# Patient Record
Sex: Female | Born: 1965 | Race: White | Hispanic: No | Marital: Married | Smoking: Current every day smoker
Health system: Southern US, Community
[De-identification: ages and names within clinical notes are randomized; demographics above are authoritative.]

## PROBLEM LIST (undated history)

## (undated) DIAGNOSIS — M1990 Unspecified osteoarthritis, unspecified site: Secondary | ICD-10-CM

## (undated) DIAGNOSIS — T7840XA Allergy, unspecified, initial encounter: Secondary | ICD-10-CM

## (undated) DIAGNOSIS — B019 Varicella without complication: Secondary | ICD-10-CM

## (undated) DIAGNOSIS — Z87440 Personal history of urinary (tract) infections: Secondary | ICD-10-CM

## (undated) DIAGNOSIS — G43909 Migraine, unspecified, not intractable, without status migrainosus: Secondary | ICD-10-CM

## (undated) HISTORY — DX: Personal history of urinary (tract) infections: Z87.440

## (undated) HISTORY — DX: Unspecified osteoarthritis, unspecified site: M19.90

## (undated) HISTORY — DX: Migraine, unspecified, not intractable, without status migrainosus: G43.909

## (undated) HISTORY — DX: Allergy, unspecified, initial encounter: T78.40XA

## (undated) HISTORY — DX: Varicella without complication: B01.9

---

## 1993-03-16 HISTORY — PX: BREAST BIOPSY: SHX20

## 1993-03-16 HISTORY — PX: BREAST EXCISIONAL BIOPSY: SUR124

## 2006-03-16 HISTORY — PX: HEMANGIOMA EXCISION: SHX1734

## 2016-03-16 DIAGNOSIS — N920 Excessive and frequent menstruation with regular cycle: Secondary | ICD-10-CM

## 2016-03-16 HISTORY — DX: Excessive and frequent menstruation with regular cycle: N92.0

## 2019-01-16 ENCOUNTER — Ambulatory Visit: Payer: 59 | Admitting: Family Medicine

## 2019-01-16 ENCOUNTER — Encounter: Payer: Self-pay | Admitting: Family Medicine

## 2019-01-16 ENCOUNTER — Other Ambulatory Visit: Payer: Self-pay

## 2019-01-16 VITALS — BP 114/77 | HR 74 | Temp 97.9°F | Resp 17 | Ht 64.5 in | Wt 123.4 lb

## 2019-01-16 DIAGNOSIS — Z130 Encounter for screening for diseases of the blood and blood-forming organs and certain disorders involving the immune mechanism: Secondary | ICD-10-CM | POA: Diagnosis not present

## 2019-01-16 DIAGNOSIS — Z131 Encounter for screening for diabetes mellitus: Secondary | ICD-10-CM

## 2019-01-16 DIAGNOSIS — Z13220 Encounter for screening for lipoid disorders: Secondary | ICD-10-CM | POA: Diagnosis not present

## 2019-01-16 DIAGNOSIS — Z0000 Encounter for general adult medical examination without abnormal findings: Secondary | ICD-10-CM | POA: Diagnosis not present

## 2019-01-16 DIAGNOSIS — Z23 Encounter for immunization: Secondary | ICD-10-CM | POA: Diagnosis not present

## 2019-01-16 DIAGNOSIS — Z1231 Encounter for screening mammogram for malignant neoplasm of breast: Secondary | ICD-10-CM

## 2019-01-16 DIAGNOSIS — Z114 Encounter for screening for human immunodeficiency virus [HIV]: Secondary | ICD-10-CM

## 2019-01-16 DIAGNOSIS — Z720 Tobacco use: Secondary | ICD-10-CM

## 2019-01-16 DIAGNOSIS — Z803 Family history of malignant neoplasm of breast: Secondary | ICD-10-CM

## 2019-01-16 DIAGNOSIS — Z975 Presence of (intrauterine) contraceptive device: Secondary | ICD-10-CM

## 2019-01-16 LAB — COMPREHENSIVE METABOLIC PANEL
ALT: 10 U/L (ref 0–35)
AST: 13 U/L (ref 0–37)
Albumin: 4.4 g/dL (ref 3.5–5.2)
Alkaline Phosphatase: 49 U/L (ref 39–117)
BUN: 15 mg/dL (ref 6–23)
CO2: 31 mEq/L (ref 19–32)
Calcium: 9.7 mg/dL (ref 8.4–10.5)
Chloride: 102 mEq/L (ref 96–112)
Creatinine, Ser: 0.67 mg/dL (ref 0.40–1.20)
GFR: 92.11 mL/min (ref >60.00)
Glucose, Bld: 81 mg/dL (ref 70–99)
Potassium: 4.7 mEq/L (ref 3.5–5.1)
Sodium: 140 mEq/L (ref 135–145)
Total Bilirubin: 0.7 mg/dL (ref 0.2–1.2)
Total Protein: 6.3 g/dL (ref 6.0–8.3)

## 2019-01-16 LAB — LIPID PANEL
Cholesterol: 204 mg/dL — ABNORMAL HIGH (ref 0–200)
HDL: 75.9 mg/dL (ref >39.00)
LDL Cholesterol: 115 mg/dL — ABNORMAL HIGH (ref 0–99)
NonHDL: 128.17
Total CHOL/HDL Ratio: 3
Triglycerides: 64 mg/dL (ref 0.0–149.0)
VLDL: 12.8 mg/dL (ref 0.0–40.0)

## 2019-01-16 LAB — CBC
HCT: 37.1 % (ref 36.0–46.0)
Hemoglobin: 12.5 g/dL (ref 12.0–15.0)
MCHC: 33.8 g/dL (ref 30.0–36.0)
MCV: 92.7 fl (ref 78.0–100.0)
Platelets: 356 10*3/uL (ref 150.0–400.0)
RBC: 4 Mil/uL (ref 3.87–5.11)
RDW: 13.2 % (ref 11.5–15.5)
WBC: 4.9 10*3/uL (ref 4.0–10.5)

## 2019-01-16 LAB — HEMOGLOBIN A1C: Hgb A1c MFr Bld: 5.8 % (ref 4.6–6.5)

## 2019-01-16 NOTE — Patient Instructions (Addendum)
Health Maintenance, Female Adopting a healthy lifestyle and getting preventive care are important in promoting health and wellness. Ask your health care provider about:  The right schedule for you to have regular tests and exams.  Things you can do on your own to prevent diseases and keep yourself healthy. What should I know about diet, weight, and exercise? Eat a healthy diet   Eat a diet that includes plenty of vegetables, fruits, low-fat dairy products, and lean protein.  Do not eat a lot of foods that are high in solid fats, added sugars, or sodium. Maintain a healthy weight Body mass index (BMI) is used to identify weight problems. It estimates body fat based on height and weight. Your health care provider can help determine your BMI and help you achieve or maintain a healthy weight. Get regular exercise Get regular exercise. This is one of the most important things you can do for your health. Most adults should:  Exercise for at least 150 minutes each week. The exercise should increase your heart rate and make you sweat (moderate-intensity exercise).  Do strengthening exercises at least twice a week. This is in addition to the moderate-intensity exercise.  Spend less time sitting. Even light physical activity can be beneficial. Watch cholesterol and blood lipids Have your blood tested for lipids and cholesterol at 53 years of age, then have this test every 5 years. Have your cholesterol levels checked more often if:  Your lipid or cholesterol levels are high.  You are older than 53 years of age.  You are at high risk for heart disease. What should I know about cancer screening? Depending on your health history and family history, you may need to have cancer screening at various ages. This may include screening for:  Breast cancer.  Cervical cancer.  Colorectal cancer.  Skin cancer.  Lung cancer. What should I know about heart disease, diabetes, and high blood  pressure? Blood pressure and heart disease  High blood pressure causes heart disease and increases the risk of stroke. This is more likely to develop in people who have high blood pressure readings, are of African descent, or are overweight.  Have your blood pressure checked: ? Every 3-5 years if you are 18-39 years of age. ? Every year if you are 40 years old or older. Diabetes Have regular diabetes screenings. This checks your fasting blood sugar level. Have the screening done:  Once every three years after age 40 if you are at a normal weight and have a low risk for diabetes.  More often and at a younger age if you are overweight or have a high risk for diabetes. What should I know about preventing infection? Hepatitis B If you have a higher risk for hepatitis B, you should be screened for this virus. Talk with your health care provider to find out if you are at risk for hepatitis B infection. Hepatitis C Testing is recommended for:  Everyone born from 1945 through 1965.  Anyone with known risk factors for hepatitis C. Sexually transmitted infections (STIs)  Get screened for STIs, including gonorrhea and chlamydia, if: ? You are sexually active and are younger than 53 years of age. ? You are older than 53 years of age and your health care provider tells you that you are at risk for this type of infection. ? Your sexual activity has changed since you were last screened, and you are at increased risk for chlamydia or gonorrhea. Ask your health care provider if   you are at risk.  Ask your health care provider about whether you are at high risk for HIV. Your health care provider may recommend a prescription medicine to help prevent HIV infection. If you choose to take medicine to prevent HIV, you should first get tested for HIV. You should then be tested every 3 months for as long as you are taking the medicine. Pregnancy  If you are about to stop having your period (premenopausal) and  you may become pregnant, seek counseling before you get pregnant.  Take 400 to 800 micrograms (mcg) of folic acid every day if you become pregnant.  Ask for birth control (contraception) if you want to prevent pregnancy. Osteoporosis and menopause Osteoporosis is a disease in which the bones lose minerals and strength with aging. This can result in bone fractures. If you are 2 years old or older, or if you are at risk for osteoporosis and fractures, ask your health care provider if you should:  Be screened for bone loss.  Take a calcium or vitamin D supplement to lower your risk of fractures.  Be given hormone replacement therapy (HRT) to treat symptoms of menopause. Follow these instructions at home: Lifestyle  Do not use any products that contain nicotine or tobacco, such as cigarettes, e-cigarettes, and chewing tobacco. If you need help quitting, ask your health care provider.  Do not use street drugs.  Do not share needles.  Ask your health care provider for help if you need support or information about quitting drugs. Alcohol use  Do not drink alcohol if: ? Your health care provider tells you not to drink. ? You are pregnant, may be pregnant, or are planning to become pregnant.  If you drink alcohol: ? Limit how much you use to 0-1 drink a day. ? Limit intake if you are breastfeeding.  Be aware of how much alcohol is in your drink. In the [place] one drink equals one 12 oz bottle of beer (355 mL), one 5 oz glass of wine (148 mL), or one 1 oz glass of hard liquor (44 mL). General instructions  Schedule regular health, dental, and eye exams.  Stay current with your vaccines.  Tell your health care provider if: ? You often feel depressed. ? You have ever been abused or do not feel safe at home. Summary  Adopting a healthy lifestyle and getting preventive care are important in promoting health and wellness.  Follow your health care provider's instructions about healthy  diet, exercising, and getting tested or screened for diseases.  Follow your health care provider's instructions on monitoring your cholesterol and blood pressure. This information is not intended to replace advice given to you by your health care provider. Make sure you discuss any questions you have with your health care provider. Document Released: 09/15/2010 Document Revised: 02/23/2018 Document Reviewed: 02/23/2018 Elsevier Patient Education  2020 [employer].  Please help [place] help you:  We are honored you have chosen [facility] for your Primary Care home. Below you will find basic instructions that you may need to access in the future. Please help [place] help you by reading the instructions, which cover many of the frequent questions we experience.   Prescription refills and request:  -In order to allow more efficient response time, please call your pharmacy for all refills. They will forward the request electronically to [place]. This allows for the quickest possible response. Request left on a nurse line can take longer to refill, since these are checked  as time allows between office patients and other phone calls.  - refill request can take up to 3-5 working days to complete.  - If request is sent electronically and request is appropiate, it is usually completed in 1-2 business days.  - all patients will need to be seen routinely for all chronic medical conditions requiring prescription medications (see follow-up below). If you are overdue for follow up on your condition, you will be asked to make an appointment and we will call in enough medication to cover you until your appointment (up to 30 days).  - all controlled substances will require a face to face visit to request/refill.  - if you desire your prescriptions to go through a new pharmacy, and have an active script at original pharmacy, you will need to call your pharmacy and have scripts transferred to new pharmacy. This is completed  between the pharmacy locations and not by your provider.    Results: If any images or labs were ordered, it can take up to 1 week to get results depending on the test ordered and the lab/facility running and resulting the test. - Normal or stable results, which do not need further discussion, may be released to your mychart immediately with attached note to you. A call may not be generated for normal results. Please make certain to sign up for mychart. If you have questions on how to activate your mychart you can call the front office.  - If your results need further discussion, our office will attempt to contact you via phone, and if unable to reach you after 2 attempts, we will release your abnormal result to your mychart with instructions.  - All results will be automatically released in mychart after 1 week.  - Your provider will provide you with explanation and instruction on all relevant material in your results. Please keep in mind, results and labs may appear confusing or abnormal to the untrained eye, but it does not mean they are actually abnormal for you personally. If you have any questions about your results that are not covered, or you desire more detailed explanation than what was provided, you should make an appointment with your provider to do so.   Our office handles many outgoing and incoming calls daily. If we have not contacted you within 1 week about your results, please check your mychart to see if there is a message first and if not, then contact our office.  In helping with this matter, you help decrease call volume, and therefore allow [place] to be able to respond to patients needs more efficiently.   Acute office visits (sick visit):  An acute visit is intended for a new problem and are scheduled in shorter time slots to allow schedule openings for patients with new problems. This is the appropriate visit to discuss a new problem. Problems will not be addressed by phone call or  Echart message. Appointment is needed if requesting treatment. In order to provide you with excellent quality medical care with proper time for you to explain your problem, have an exam and receive treatment with instructions, these appointments should be limited to one new problem per visit. If you experience a new problem, in which you desire to be addressed, please make an acute office visit, we save openings on the schedule to accommodate you. Please do not save your new problem for any other type of visit, let us take care of it properly and quickly for you.   Follow  up visits:  Depending on your condition(s) your provider will need to see you routinely in order to provide you with quality care and prescribe medication(s). Most chronic conditions (Example: hypertension, Diabetes, depression/anxiety... etc), require visits a couple times a year. Your provider will instruct you on proper follow up for your personal medical conditions and history. Please make certain to make follow up appointments for your condition as instructed. Failing to do so could result in lapse in your medication treatment/refills. If you request a refill, and are overdue to be seen on a condition, we will always provide you with a 30 day script (once) to allow you time to schedule.    Medicare wellness (well visit): - we have a wonderful Nurse Maudie Mercury), that will meet with you and provide you will yearly medicare wellness visits. These visits should occur yearly (can not be scheduled less than 1 calendar year apart) and cover preventive health, immunizations, advance directives and screenings you are entitled to yearly through your medicare benefits. Do not miss out on your entitled benefits, this is when medicare will pay for these benefits to be ordered for you.  These are strongly encouraged by your provider and is the appropriate type of visit to make certain you are up to date with all preventive health benefits. If you have not  had your medicare wellness exam in the last 12 months, please make certain to schedule one by calling the office and schedule your medicare wellness with Maudie Mercury as soon as possible.   Yearly physical (well visit):  - Adults are recommended to be seen yearly for physicals. Check with your insurance and date of your last physical, most insurances require one calendar year between physicals. Physicals include all preventive health topics, screenings, medical exam and labs that are appropriate for gender/age and history. You may have fasting labs needed at this visit. This is a well visit (not a sick visit), new problems should not be covered during this visit (see acute visit).  - Pediatric patients are seen more frequently when they are younger. Your provider will advise you on well child visit timing that is appropriate for your their age. - This is not a medicare wellness visit. Medicare wellness exams do not have an exam portion to the visit. Some medicare companies allow for a physical, some do not allow a yearly physical. If your medicare allows a yearly physical you can schedule the medicare wellness with our nurse Maudie Mercury and have your physical with your provider after, on the same day. Please check with insurance for your full benefits.   Late Policy/No Shows:  - all new patients should arrive 15-30 minutes earlier than appointment to allow [place] time  to  obtain all personal demographics,  insurance information and for you to complete office paperwork. - All established patients should arrive 10-15 minutes earlier than appointment time to update all information and be checked in .  - In our best efforts to run on time, if you are late for your appointment you will be asked to either reschedule or if able, we will work you back into the schedule. There will be a wait time to work you back in the schedule,  depending on availability.  - If you are unable to make it to your appointment as scheduled, please call  24 hours ahead of time to allow [place] to fill the time slot with someone else who needs to be seen. If you do not cancel your appointment ahead of  time, you may be charged a no show fee.      RE: MyChart  Dear Ms. Schoultz-Lindqvist  MyChart makes it easy for you to view your health information - all in one secure location - from any computer or mobile device at any time. Use the activation code below to enroll in MyChart online at [email or web address]   Once your account is activated, you can:  Marland Kitchen View your test results. . Communicate securely with your physician's office.  . View your medical history, allergies, medications, and immunizations. . Receive care virtually through an e-Visit.   If you are over 18, you may use features of MyChart to manage the health information of your spouse, children or others you care for.  Download child and adult access forms at [email or web address].    As you activate your MyChart account and need any technical assistance, please call the MyChart technical support line at [phone]).  Be sure to also download the MyChart app for your mobile device.   Thank you for choosing [facility] for your family's health care needs!   MyChart Activation Code:  [id number] Expires: 03/02/2019 11:45 AM             [facility]  [address] [place], [place] [address]

## 2019-01-16 NOTE — Progress Notes (Signed)
Patient ID: April Sexton, female  DOB: 1965-08-13, 53 y.o.   MRN: [id number] Patient Care Team    Relationship Specialty Notifications Start End  Ma Hillock, DO PCP - General Family Medicine  01/16/19     Chief Complaint  Patient presents with  . Establish Care    CPE. Pt has lived in [place] for 15 months. Prior PCP in [place]. Pap smear 2018- Normal  Mammogram 2018 Negative.     Subjective:  April Sexton is a 53 y.o.  Female  present for CPE. All past medical history, surgical history, allergies, family history, immunizations, medications and social history were updated in the electronic medical record today. All recent labs, ED visits and hospitalizations within the last year were reviewed.  Health maintenance:  Colonoscopy: completed 2018, pt reported normal. 10 year follow up.  Mammogram: completed:2018, birads 1. Mother Fhx breast cancer in her 61s.   Cervical cancer screening: last pap: 2018, results: NL, completed by: In [place]. She has IUD placed 2018 (unknown type- pt states she was told it was good for 5 yrs).  Patient's last menstrual period was 01/06/2019 (exact date). IUD placed for menorrhagia.  Immunizations: tdap 12/2018, Influenza UTD 2020 (encouraged yearly),  shingrix  #1 today- pt agreeable.  DTP-diphtheria, tetanus, polio 05/16/2017 Typhoid-Typhim Vi completed 05/16/2017.  Next dose due April 29, 2020. cholera- Dukoral next dose due April 30, 2019 Hepatitis B series completed in 2010. Infectious disease screening: HIV agreeable to testing.  DEXA: routine screen.  Assistive device: none Oxygen use: none Patient has a Dental home. Hospitalizations/ED visits: reviewed   Depression screen [facility] 2/9 01/16/2019  Decreased Interest 0  Down, Depressed, Hopeless 0  PHQ - 2 Score 0   No flowsheet data found.  Immunization History  Administered Date(s) Administered  . DTaP 04/29/1966, 05/28/1966, 06/28/1966, 06/27/1969,  02/27/1980  . Hepatitis B 01/22/2009, 02/04/2009, 03/28/2009  . Influenza,inj,Quad PF,6+ Mos 12/29/2018  . MMR 01/02/2019  . Tdap 01/02/2019  . Varicella 01/02/2019  . Zoster Recombinat (Shingrix) 01/16/2019   Past Medical History:  Diagnosis Date  . Allergy   . Arthritis    foot  . Chicken pox   . History of UTI   . Migraines    Allergies  Allergen Reactions  . Lactose Intolerance (Gi) Diarrhea   Past Surgical History:  Procedure Laterality Date  . BREAST BIOPSY  1995  . HEMANGIOMA EXCISION  2008   Right Jaw    Family History  Problem Relation Age of Onset  . Arthritis Mother   . Breast cancer Mother   . Diabetes Mother   . Hypertension Mother   . Alcohol abuse Father   . Early death Father   . Hyperlipidemia Sister   . Alcohol abuse Brother   . Hypertension Brother   . 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 If you have any questions, ask your nurse or doctor.        b  complex vitamins capsule Take 1 capsule by mouth daily.   Calcium 200 MG Tabs Take 3 tablets by mouth daily.   co-enzyme Q-10 30 MG capsule Take 30 mg by mouth daily.   Iron 325 (65 Fe) MG Tabs Take 1 tablet by mouth daily.   levonorgestrel 20 MCG/24HR IUD Commonly known as: MIRENA 1 each by Intrauterine route once.   Magnesium 400 MG Caps Take 1 capsule by mouth daily.   Melatonin 2.5 MG Caps Take 2 capsules by mouth at bedtime.   MULTIVITAL PO Take by mouth.   Vitamin D3 10 MCG (400 UNIT) Caps Take 2 capsules by mouth daily.       All past medical history,  surgical history, allergies, family history, immunizations andmedications were updated in the EMR today and reviewed under the history and medication portions of their EMR.     No results found for this or any previous visit (from the past 2160 hour(s)).  Patient was never admitted.   ROS: 14 pt review of systems performed and negative (unless mentioned in an HPI)  Objective: BP 114/77 (BP Location: Right Arm, Patient Position: Sitting, Cuff Size: Normal)   Pulse 74   Temp 97.9 F (36.6 C) (Temporal)   Resp 17   Ht 5' 4.5" (1.638 m)   Wt 123 lb 6 oz (56 kg)   LMP 01/06/2019 (Exact Date)   SpO2 98%   BMI 20.85 kg/m  Gen: Afebrile. No acute distress. Nontoxic in appearance, well-developed, well-nourished, very pleasant, Caucasian female HENT: AT. [place]. Bilateral TM visualized and normal in appearance, normal external auditory canal. MMM, no oral lesions, adequate dentition. Bilateral nares within normal limits. Throat without erythema, ulcerations or exudates.  No cough on exam, no hoarseness on exam. Eyes:Pupils Equal Round Reactive to light, Extraocular movements intact,  Conjunctiva without redness, discharge or icterus. Neck/lymp/endocrine: Supple, no lymphadenopathy, no thyromegaly CV: RRR no murmur, no edema, +2/4 P posterior tibialis pulses.  No carotid bruits. No JVD. Chest: CTAB, no wheeze, rhonchi or crackles.  Normal respiratory effort.  Good air movement. Abd: Soft.  Flat. NTND. BS present.  No masses palpated. No hepatosplenomegaly. No rebound tenderness or guarding. Skin: No rashes, purpura or petechiae. Warm and well-perfused. Skin intact. Neuro/Msk:  Normal gait. PERLA. EOMi. Alert. Oriented x3.  Cranial nerves II through XII intact. Muscle strength 5/5 upper/lower extremity. DTRs equal bilaterally. Psych: Normal affect, dress and demeanor. Normal speech. Normal thought content and judgment.  No exam data present  Assessment/plan: April Sexton is a 53 y.o.  female present for EST care/CPE Screening for deficiency anemia - CBC Screening cholesterol level - Lipid panel Diabetes mellitus screening - Comp Met (CMET) - Hemoglobin A1c Encounter for screening mammogram for malignant neoplasm of breast/family history of breast cancer in first-degree relative - fhx in mother in her 47s and recurrent in 81s.  Her maternal aunt and maternal cousin have also been diagnosed with breast cancer. - MM 3D SCREEN BREAST BILATERAL; Future Encounter for screening for HIV - HIV antibody (with reflex) Need for shingles vaccine Rpt by nurse visit in 3 mos. For shingrix #2 - Varicella-zoster vaccine IM  IUD (intrauterine device) in place Pt describes an IUD like device that is good for 5 years.   Encounter for preventive health examination Patient was encouraged to exercise greater than 150 minutes a week. Patient was encouraged to choose a diet filled with fresh fruits and vegetables, and lean meats. AVS provided to patient today for education/recommendation on gender specific  health and safety maintenance. Colonoscopy: completed 2018, pt reported normal. 10 year follow up.  Mammogram: completed:2018, birads 1. Mother Fhx breast cancer in her 59s.   Cervical cancer screening: last pap: 2018, results: NL, completed by: In [place]. She has IUD (2018- good for 5 years). She would like to wait until next year for referral to GYN.  Immunizations: tdap 12/2018 , Influenza UTD 2020 (encouraged yearly),  shingrix  #1 today- pt agreeable.  Infectious disease screening: HIV agreeable to testing.  DEXA: routine screen.   Return in about 1 year (around 01/16/2020) for CPE (30 min).   Orders Placed This Encounter  Procedures  . MM 3D SCREEN BREAST BILATERAL  . Varicella-zoster vaccine IM  . CBC  . Comp Met (CMET)  . Lipid panel  . Hemoglobin A1c  . HIV antibody (with reflex)    Electronically signed by: Howard Pouch, [place]

## 2019-01-16 NOTE — Addendum Note (Signed)
Addended by: Howard Pouch A on: 01/16/2019 02:33 PM   Modules accepted: Level of Service

## 2019-01-17 LAB — HIV ANTIBODY (ROUTINE TESTING W REFLEX): HIV 1&2 Ab, 4th Generation: NONREACTIVE

## 2019-04-05 ENCOUNTER — Other Ambulatory Visit: Payer: Self-pay

## 2019-04-05 ENCOUNTER — Ambulatory Visit
Admission: RE | Admit: 2019-04-05 | Discharge: 2019-04-05 | Disposition: A | Discharge status: Home / Self Care | Payer: 59 | Source: Ambulatory Visit | Attending: Family Medicine | Admitting: Family Medicine

## 2019-04-05 DIAGNOSIS — Z1231 Encounter for screening mammogram for malignant neoplasm of breast: Secondary | ICD-10-CM

## 2019-04-05 IMAGING — MG DIGITAL SCREENING BILAT W/ TOMO W/ CAD
8 series · 9 of 24 positions shown · non-contrast
Comparison: None.

CLINICAL DATA: Screening.

EXAM:
DIGITAL SCREENING BILATERAL MAMMOGRAM WITH TOMO AND CAD

[R CC synth-2D]
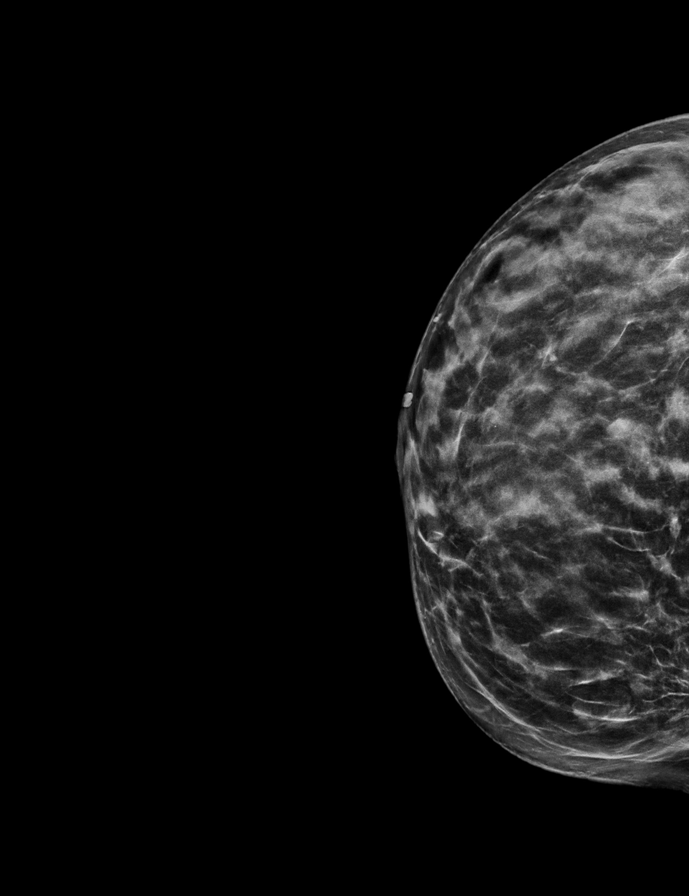

[L CC synth-2D]
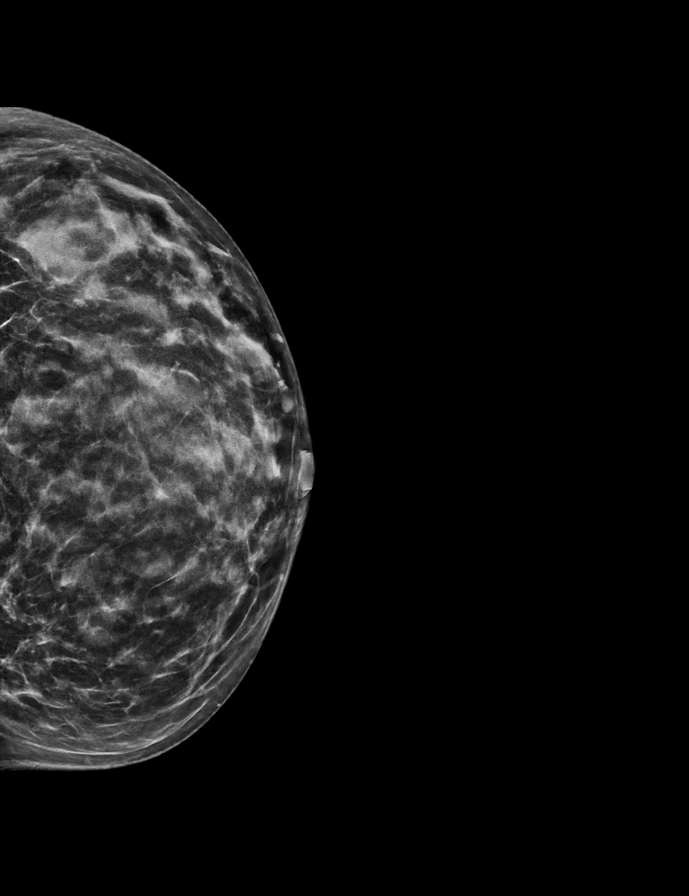

[R MLO synth-2D]
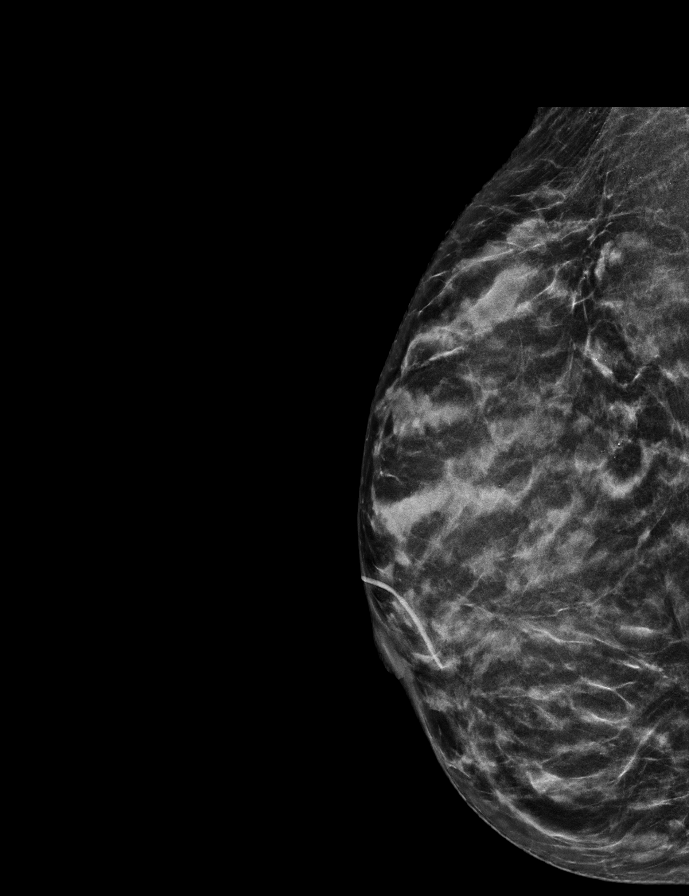

[L MLO synth-2D]
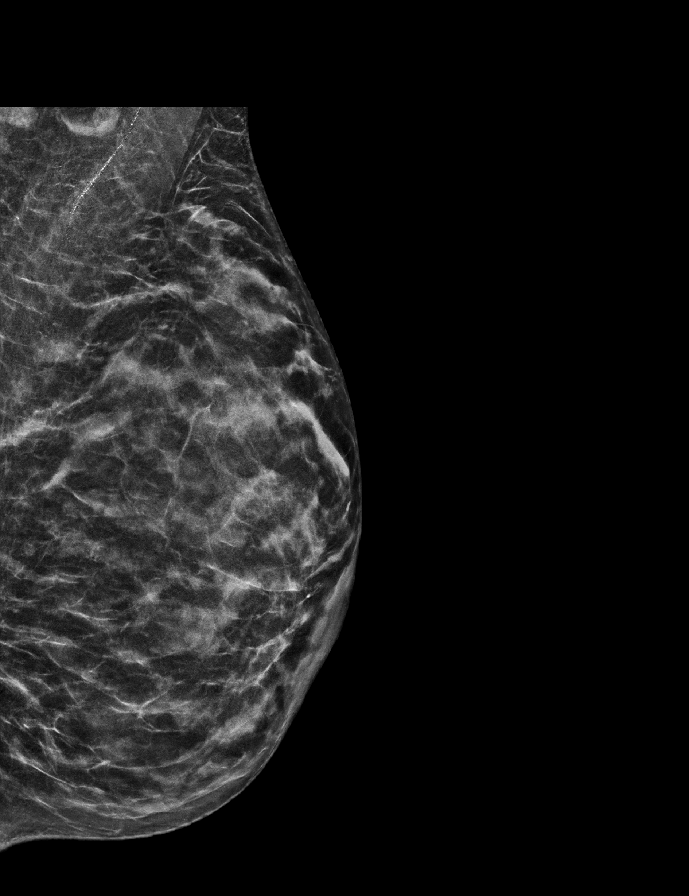

[R MLO tomo · 2 of 44 frames shown]
[frame 15/44]
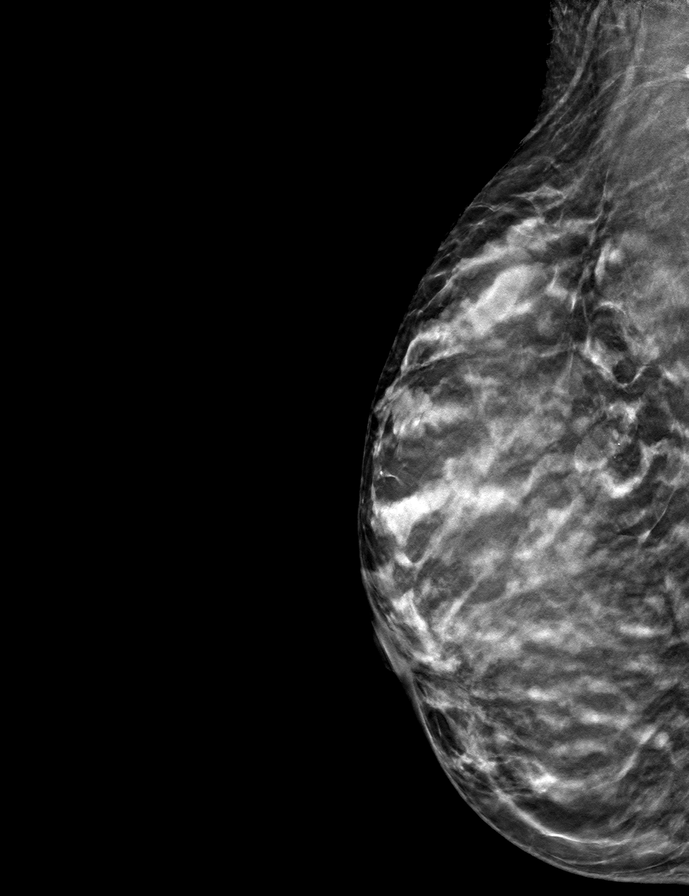
[frame 23/44]
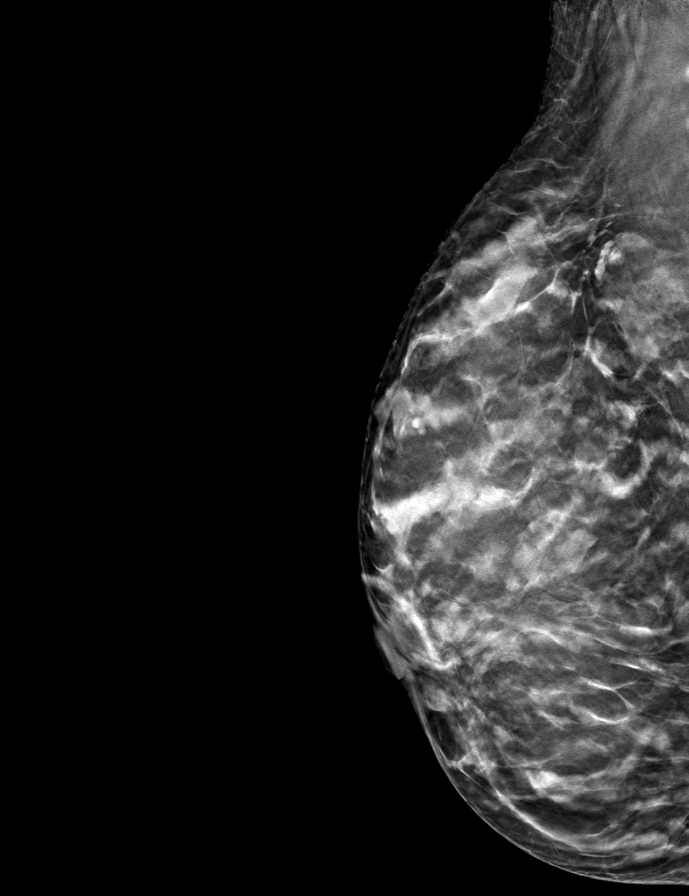

[R CC tomo · tomo slice 25/50.0]
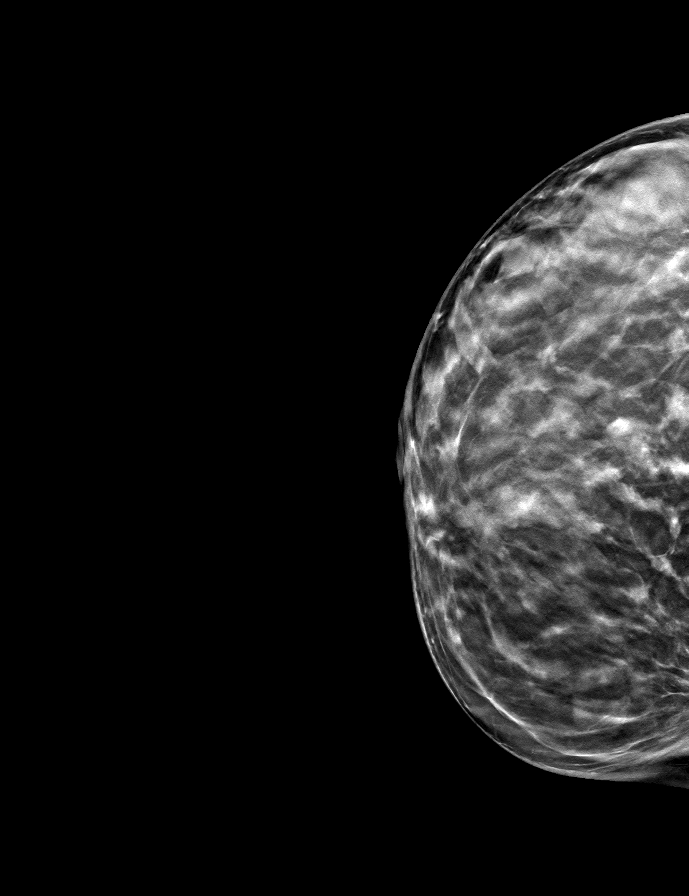

[L MLO tomo · tomo slice 24/47.0]
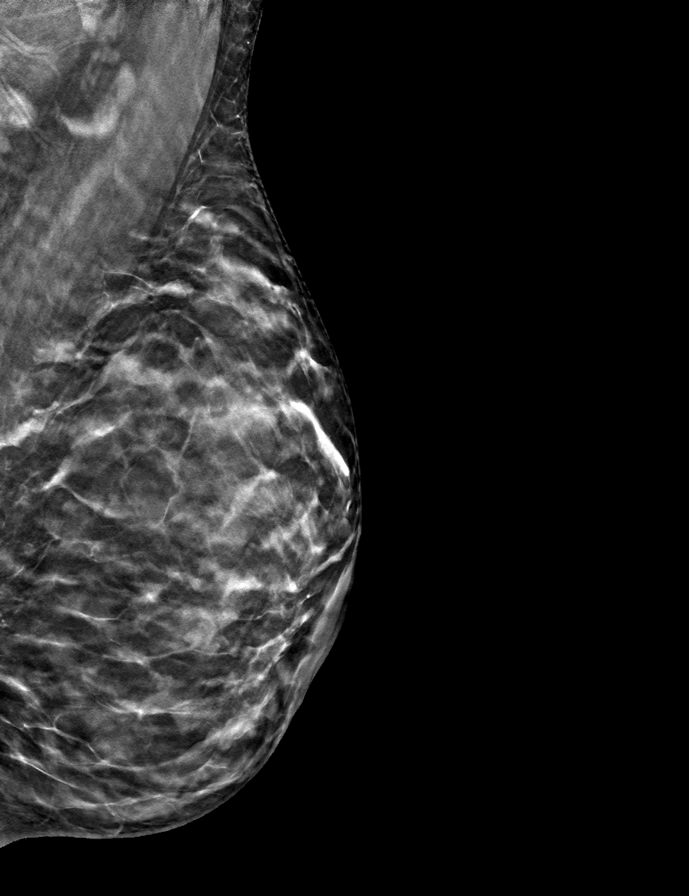

[L CC tomo · tomo slice 24/47.0]
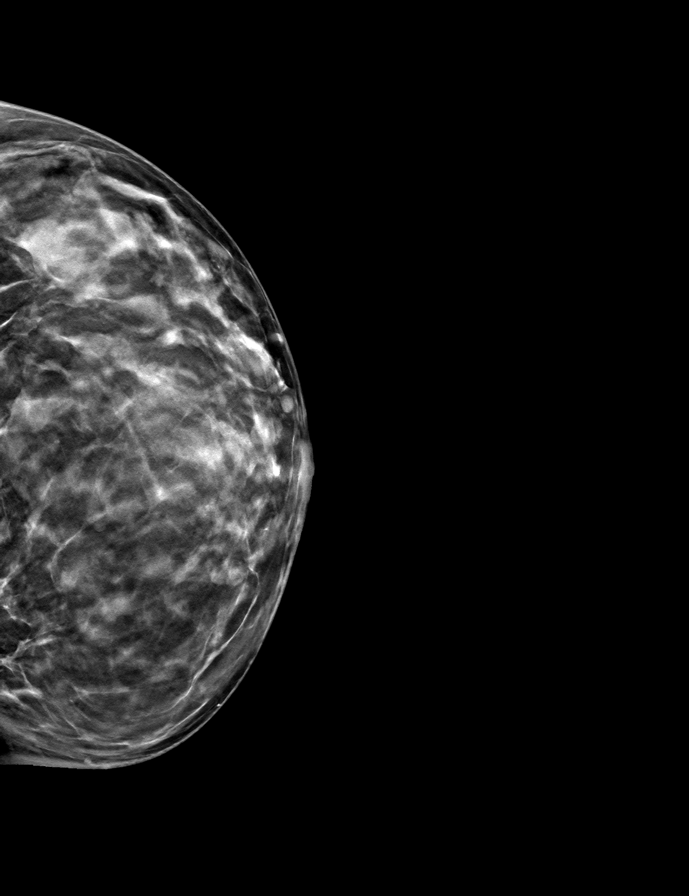

[9 of 24 positions shown; findings below may reference images not displayed]

ACR Breast Density Category c: The breast tissue is heterogeneously
dense, which may obscure small masses
FINDINGS: There are no findings suspicious for malignancy. Images were
processed with CAD.
IMPRESSION: No mammographic evidence of malignancy. A result letter of this
screening mammogram will be mailed directly to the patient.

RECOMMENDATION:
Screening mammogram in one year. (Code:[4W])

BI-RADS CATEGORY  1: Negative.

## 2020-01-11 ENCOUNTER — Other Ambulatory Visit: Payer: Self-pay

## 2020-01-12 ENCOUNTER — Ambulatory Visit (INDEPENDENT_AMBULATORY_CARE_PROVIDER_SITE_OTHER): Payer: 59 | Admitting: Family Medicine

## 2020-01-12 ENCOUNTER — Other Ambulatory Visit (HOSPITAL_COMMUNITY)
Admission: RE | Admit: 2020-01-12 | Discharge: 2020-01-12 | Disposition: A | Discharge status: Home / Self Care | Payer: 59 | Source: Ambulatory Visit | Attending: Family Medicine | Admitting: Family Medicine

## 2020-01-12 ENCOUNTER — Encounter: Payer: Self-pay | Admitting: Family Medicine

## 2020-01-12 VITALS — BP 106/73 | HR 83 | Temp 97.9°F | Ht 63.5 in | Wt 114.0 lb

## 2020-01-12 DIAGNOSIS — Z1211 Encounter for screening for malignant neoplasm of colon: Secondary | ICD-10-CM

## 2020-01-12 DIAGNOSIS — Z01419 Encounter for gynecological examination (general) (routine) without abnormal findings: Secondary | ICD-10-CM

## 2020-01-12 DIAGNOSIS — Z1231 Encounter for screening mammogram for malignant neoplasm of breast: Secondary | ICD-10-CM

## 2020-01-12 DIAGNOSIS — Z131 Encounter for screening for diabetes mellitus: Secondary | ICD-10-CM

## 2020-01-12 DIAGNOSIS — Z130 Encounter for screening for diseases of the blood and blood-forming organs and certain disorders involving the immune mechanism: Secondary | ICD-10-CM

## 2020-01-12 DIAGNOSIS — Z13220 Encounter for screening for lipoid disorders: Secondary | ICD-10-CM

## 2020-01-12 DIAGNOSIS — Z1159 Encounter for screening for other viral diseases: Secondary | ICD-10-CM | POA: Diagnosis not present

## 2020-01-12 DIAGNOSIS — Z0000 Encounter for general adult medical examination without abnormal findings: Secondary | ICD-10-CM

## 2020-01-12 NOTE — Progress Notes (Signed)
This visit occurred during the SARS-CoV-2 public health emergency.  Safety protocols were in place, including screening questions prior to the visit, additional usage of staff PPE, and extensive cleaning of exam room while observing appropriate contact time as indicated for disinfecting solutions.    Patient ID: April Sexton, female  DOB: 1965/10/30, 54 y.o.   MRN: [id number] Patient Care Team    Relationship Specialty Notifications Start End  Ma Hillock, DO PCP - General Family Medicine  01/16/19     Chief Complaint  Patient presents with  . Annual Exam    pt is not fasting    Subjective: April Sexton is a 54 y.o.  Female  present for CPE. All past medical history, surgical history, allergies, family history, immunizations, medications and social history were updated in the electronic medical record today. All recent labs, ED visits and hospitalizations within the last year were reviewed.  Health maintenance:  Colonoscopy: cologuard 9/ 2018, pt reported normal.  Mammogram: completed:03/2019- Mother Fhx breast cancer in her 61s.   Cervical cancer screening: collected today Immunizations: tdap 12/2018, Influenza UTD 2021 (encouraged yearly),  shingrix  #1- pt never returned for #2. [employer] x3. DTP-diphtheria, tetanus, polio 05/16/2017 Typhoid-Typhim Vi completed 05/16/2017.  Next dose due April 29, 2020. cholera- Dukoral next dose due April 30, 2019 Hepatitis B series completed in 2010. Infectious disease screening: HIV completed/ Hep C pt agreeable today DEXA: routine screen.  Assistive device: none Oxygen ZDG:UYQI Patient has a Dental home. Hospitalizations/ED visits: reviewed   Depression screen [facility] 2/9 01/12/2020 01/16/2019  Decreased Interest 0 0  Down, Depressed, Hopeless 0 0  PHQ - 2 Score 0 0   No flowsheet data found.   Immunization History  Administered Date(s) Administered  . DTP 05/16/2017  . DTaP 04/29/1966,  05/28/1966, 06/28/1966, 06/27/1969, 02/27/1980  . Hepatitis B 01/22/2009, 02/04/2009, 03/28/2009  . Influenza,inj,Quad PF,6+ Mos 12/29/2018  . Influenza-Unspecified 01/05/2020  . MMR 01/02/2019  . PFIZER SARS-COV-2 Vaccination 05/26/2019, 06/16/2019, 01/05/2020  . Tdap 01/02/2019  . Typhoid Inactivated 05/16/2017  . Varicella 01/02/2019  . Zoster Recombinat (Shingrix) 01/16/2019    Past Medical History:  Diagnosis Date  . Allergy    hayfever/birch tree allergy.   . Arthritis    foot  . Chicken pox   . History of UTI   . Menorrhagia 2018   IUD placed for menorrhagia  . Migraines    Allergies  Allergen Reactions  . Lactose Intolerance (Gi) Diarrhea   Past Surgical History:  Procedure Laterality Date  . BREAST BIOPSY  1995   benign  . BREAST EXCISIONAL BIOPSY Right 1995  . HEMANGIOMA EXCISION  2008   Right Jaw    Family History  Problem Relation Age of Onset  . Arthritis Mother   . Breast cancer Mother 69       Recurrent at 24  . Diabetes Mother   . Hypertension Mother   . Alcohol abuse Father   . Early death Father   . Hyperlipidemia Sister   . Alcohol abuse Brother   . Hypertension Brother   . Asthma Paternal Grandfather   . Breast cancer Maternal Aunt   . 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 If you have any questions, ask your nurse or doctor.        b complex vitamins capsule Take 1 capsule by mouth daily.   Calcium 200 MG Tabs Take 3 tablets by mouth daily.   co-enzyme Q-10 30 MG capsule Take 100 mg by mouth daily.   Iron 325  (65 Fe) MG Tabs Take 1 tablet by mouth daily.   levonorgestrel 20 MCG/24HR IUD Commonly known as: MIRENA 1 each by Intrauterine route once.   Magnesium 400 MG Caps Take 1 capsule by mouth daily.   Melatonin 2.5 MG Caps Take 2 capsules by mouth at bedtime.   MULTIVITAL PO Take by mouth.   Vitamin D3 10 MCG (400 UNIT) Caps Take 2 capsules by mouth daily.       All past medical history, surgical history, allergies, family history, immunizations andmedications were updated in the EMR today and reviewed under the history and medication portions of their EMR.     Recent Results (from the past 2160 hour(s))  CBC with Differential/Platelet     Status: None   Collection Time: 01/12/20  3:00 PM  Result Value Ref Range   WBC 5.1 3.8 - 10.8 Thousand/uL   RBC 4.34 3.80 - 5.10 Million/uL   Hemoglobin 13.3 11.7 - 15.5 g/dL   HCT 40.0 35 - 45 %   MCV 92.2 80.0 - 100.0 fL   MCH 30.6 27.0 - 33.0 pg   MCHC 33.3 32.0 - 36.0 g/dL   RDW 11.8 11.0 - 15.0 %   Platelets 376 140 - 400 Thousand/uL   MPV 10.5 7.5 - 12.5 fL   Neutro Abs 2,096 1,500 - 7,800 cells/uL   Lymphs Abs 2,484 850 - 3,900 cells/uL   Absolute Monocytes 388 200 - 950 cells/uL   Eosinophils Absolute 71 15.0 - 500.0 cells/uL   Basophils Absolute 61 0.0 - 200.0 cells/uL   Neutrophils Relative % 41.1 %   Total Lymphocyte 48.7 %   Monocytes Relative 7.6 %   Eosinophils Relative 1.4 %   Basophils Relative 1.2 %  Comprehensive metabolic panel     Status: None   Collection Time: 01/12/20  3:00 PM  Result Value Ref Range   Glucose, Bld 77 65 - 99 mg/dL    Comment: .            Fasting reference interval .    BUN 14 7 - 25 mg/dL   Creat 0.70 0.50 - 1.05 mg/dL    Comment: For patients >52 years of age, the reference limit for Creatinine is approximately 13% higher for people identified as African-American. .    BUN/Creatinine Ratio NOT APPLICABLE 6 - 22 (calc)   Sodium 141 135 - 146 mmol/L   Potassium 4.2 3.5 - 5.3  mmol/L   Chloride 106 98 - 110 mmol/L   CO2 25 20 - 32 mmol/L   Calcium 9.6 8.6 - 10.4 mg/dL   Total Protein 6.5 6.1 - 8.1 g/dL   Albumin 4.6 3.6 - 5.1 g/dL   Globulin 1.9 1.9 - 3.7 g/dL (calc)   AG Ratio 2.4 1.0 - 2.5 (calc)   Total Bilirubin 0.6 0.2 - 1.2 mg/dL  Alkaline phosphatase (APISO) 61 37 - 153 U/L   AST 14 10 - 35 U/L   ALT 15 6 - 29 U/L  Hemoglobin A1c     Status: None   Collection Time: 01/12/20  3:00 PM  Result Value Ref Range   Hgb A1c MFr Bld 5.2 <5.7 % of total Hgb    Comment: For the purpose of screening for the presence of diabetes: . <5.7%       Consistent with the absence of diabetes 5.7-6.4%    Consistent with increased risk for diabetes             (prediabetes) > or =6.5%  Consistent with diabetes . This assay result is consistent with a decreased risk of diabetes. . Currently, no consensus exists regarding use of hemoglobin A1c for diagnosis of diabetes in children. . According to American Diabetes Association (ADA) guidelines, hemoglobin A1c <7.0% represents optimal control in non-pregnant diabetic patients. Different metrics may apply to specific patient populations.  Standards of Medical Care in Diabetes(ADA). .    Mean Plasma Glucose 103 (calc)   eAG (mmol/L) 5.7 (calc)  Lipid panel     Status: Abnormal   Collection Time: 01/12/20  3:00 PM  Result Value Ref Range   Cholesterol 202 (H) <200 mg/dL   HDL 71 > OR = 50 mg/dL   Triglycerides 133 <150 mg/dL   LDL Cholesterol (Calc) 106 (H) mg/dL (calc)    Comment: Reference range: <100 . Desirable range <100 mg/dL for primary prevention;   <70 mg/dL for patients with CHD or diabetic patients  with > or = 2 CHD risk factors. Marland Kitchen LDL-C is now calculated using the Martin-Hopkins  calculation, which is a validated novel method providing  better accuracy than the Friedewald equation in the  estimation of LDL-C.  Cresenciano Genre et al. Annamaria Helling. 5102;585(27): 2061-2068   (http://education.[facility].com/faq/FAQ164)    Total CHOL/HDL Ratio 2.8 <5.0 (calc)   Non-HDL Cholesterol (Calc) 131 (H) <130 mg/dL (calc)    Comment: For patients with diabetes plus 1 major ASCVD risk  factor, treating to a non-HDL-C goal of <100 mg/dL  (LDL-C of <70 mg/dL) is considered a therapeutic  option.   Hepatitis C Antibody     Status: None   Collection Time: 01/12/20  3:00 PM  Result Value Ref Range   Hepatitis C Ab NON-REACTIVE NON-REACTI   SIGNAL TO CUT-OFF 0.00 <1.00    Comment: . HCV antibody was non-reactive. There is no laboratory  evidence of HCV infection. . In most cases, no further action is required. However, if recent HCV exposure is suspected, a test for HCV RNA (test code [id number]) is suggested. . For additional information please refer to http://education.[facility].com/faq/FAQ22v1 (This link is being provided for informational/ educational purposes only.) .     MM 3D SCREEN BREAST BILATERAL  Result Date: 04/06/2019 CLINICAL DATA:  Screening. EXAM: DIGITAL SCREENING BILATERAL MAMMOGRAM WITH TOMO AND CAD COMPARISON:  None. ACR Breast Density Category c: The breast tissue is heterogeneously dense, which may obscure small masses FINDINGS: There are no findings suspicious for malignancy. Images were processed with CAD. IMPRESSION: No mammographic evidence of malignancy. A result letter of this screening mammogram will be mailed directly to the patient. RECOMMENDATION: Screening mammogram in one year. (Code:SM-B-01Y) BI-RADS CATEGORY  1: Negative. Electronically Signed   By: Claudie Revering M.D.   On: 04/06/2019 13:21     ROS: 14 pt review of systems performed and negative (unless mentioned in an HPI)  Objective: BP  106/73   Pulse 83   Temp 97.9 F (36.6 C) (Oral)   Ht 5' 3.5" (1.613 m)   Wt 114 lb (51.7 kg)   LMP 11/30/2019   SpO2 98%   BMI 19.88 kg/m  Gen: Afebrile. No acute distress. Nontoxic in appearance, well-developed,  well-nourished, pleasant female HENT: AT. April Sexton. Bilateral TM visualized and normal in appearance, normal external auditory canal. MMM, no oral lesions, adequate dentition. Bilateral nares within normal limits. Throat without erythema, ulcerations or exudates.  No cough on exam, no hoarseness on exam. Eyes:Pupils Equal Round Reactive to light, Extraocular movements intact,  Conjunctiva without redness, discharge or icterus. Neck/lymp/endocrine: Supple, no lymphadenopathy, no thyromegaly CV: RRR no murmur, no edema, +2/4 P posterior tibialis pulses.  Chest: CTAB, no wheeze, rhonchi or crackles.  Normal respiratory effort.  Good air movement. Abd: Soft.  Flat. NTND. BS present.  No masses palpated. No hepatosplenomegaly. No rebound tenderness or guarding. Skin: No rashes, purpura or petechiae. Warm and well-perfused. Skin intact. Neuro/Msk:  Normal gait. PERLA. EOMi. Alert. Oriented x3.  Cranial nerves II through XII intact. Muscle strength 5/5 upper/lower extremity. DTRs equal bilaterally. Psych: Normal affect, dress and demeanor. Normal speech. Normal thought content and judgment. Breasts: breasts appear normal, symmetrical, no tenderness on exam, no suspicious masses, no skin or nipple changes or axillary nodes. GYN:  External genitalia within normal limits, normal hair distribution, no lesions. Urethral meatus normal, no lesions. Vaginal mucosa pink, moist, normal rugae, no lesions. No cystocele or rectocele. cervix without lesions, no discharge. Bimanual exam revealed normal uterus.  No bladder/suprapubic fullness, masses or tenderness. No cervical motion tenderness. No adnexal fullness. Anus and perineum within normal limits, no lesions.  No exam data present  Assessment/plan: April Sexton is a 53 y.o. female present for  CPE Colon cancer screening -Patient reports she had Cologuard testing over 3 years ago. - Ambulatory referral to Gastroenterology Need for hepatitis C  screening test - Hepatitis C Antibody Pap smear, as part of routine gynecological examination - Cytology - PAP( Story) Lipid screening - Lipid panel Encounter for screening mammogram for malignant neoplasm of breast - MM 3D SCREEN BREAST BILATERAL; Future Diabetes mellitus screening - Comprehensive metabolic panel - Hemoglobin A1c Screening for deficiency anemia - CBC with Differential/Platelet Encounter for preventive health examination Patient was encouraged to exercise greater than 150 minutes a week. Patient was encouraged to choose a diet filled with fresh fruits and vegetables, and lean meats. AVS provided to patient today for education/recommendation on gender specific health and safety maintenance. Colonoscopy: cologuard 9/ 2018, pt reported normal.  Mammogram: completed:03/2019- Mother Fhx breast cancer in her 21s.   Cervical cancer screening: collected today Immunizations: UTD Infectious disease screening: HIV completed/ Hep C pt agreeable today DEXA: routine screen.    Return in about 1 year (around 01/11/2021) for CPE (30 min).  Orders Placed This Encounter  Procedures  . MM 3D SCREEN BREAST BILATERAL  . CBC with Differential/Platelet  . Comprehensive metabolic panel  . Hemoglobin A1c  . Lipid panel  . Hepatitis C Antibody  . Ambulatory referral to Gastroenterology    No orders of the defined types were placed in this encounter.   Referral Orders     Ambulatory referral to Gastroenterology   Electronically signed by: Howard Pouch, DO [place]

## 2020-01-12 NOTE — Patient Instructions (Signed)
Health Maintenance, Female Adopting a healthy lifestyle and getting preventive care are important in promoting health and wellness. Ask your health care provider about:  The right schedule for you to have regular tests and exams.  Things you can do on your own to prevent diseases and keep yourself healthy. What should I know about diet, weight, and exercise? Eat a healthy diet   Eat a diet that includes plenty of vegetables, fruits, low-fat dairy products, and lean protein.  Do not eat a lot of foods that are high in solid fats, added sugars, or sodium. Maintain a healthy weight Body mass index (BMI) is used to identify weight problems. It estimates body fat based on height and weight. Your health care provider can help determine your BMI and help you achieve or maintain a healthy weight. Get regular exercise Get regular exercise. This is one of the most important things you can do for your health. Most adults should:  Exercise for at least 150 minutes each week. The exercise should increase your heart rate and make you sweat (moderate-intensity exercise).  Do strengthening exercises at least twice a week. This is in addition to the moderate-intensity exercise.  Spend less time sitting. Even light physical activity can be beneficial. Watch cholesterol and blood lipids Have your blood tested for lipids and cholesterol at 54 years of age, then have this test every 5 years. Have your cholesterol levels checked more often if:  Your lipid or cholesterol levels are high.  You are older than 54 years of age.  You are at high risk for heart disease. What should I know about cancer screening? Depending on your health history and family history, you may need to have cancer screening at various ages. This may include screening for:  Breast cancer.  Cervical cancer.  Colorectal cancer.  Skin cancer.  Lung cancer. What should I know about heart disease, diabetes, and high blood  pressure? Blood pressure and heart disease  High blood pressure causes heart disease and increases the risk of stroke. This is more likely to develop in people who have high blood pressure readings, are of African descent, or are overweight.  Have your blood pressure checked: ? Every 3-5 years if you are 18-39 years of age. ? Every year if you are 40 years old or older. Diabetes Have regular diabetes screenings. This checks your fasting blood sugar level. Have the screening done:  Once every three years after age 40 if you are at a normal weight and have a low risk for diabetes.  More often and at a younger age if you are overweight or have a high risk for diabetes. What should I know about preventing infection? Hepatitis B If you have a higher risk for hepatitis B, you should be screened for this virus. Talk with your health care provider to find out if you are at risk for hepatitis B infection. Hepatitis C Testing is recommended for:  Everyone born from 1945 through 1965.  Anyone with known risk factors for hepatitis C. Sexually transmitted infections (STIs)  Get screened for STIs, including gonorrhea and chlamydia, if: ? You are sexually active and are younger than 54 years of age. ? You are older than 54 years of age and your health care provider tells you that you are at risk for this type of infection. ? Your sexual activity has changed since you were last screened, and you are at increased risk for chlamydia or gonorrhea. Ask your health care provider if   you are at risk.  Ask your health care provider about whether you are at high risk for HIV. Your health care provider may recommend a prescription medicine to help prevent HIV infection. If you choose to take medicine to prevent HIV, you should first get tested for HIV. You should then be tested every 3 months for as long as you are taking the medicine. Pregnancy  If you are about to stop having your period (premenopausal) and  you may become pregnant, seek counseling before you get pregnant.  Take 400 to 800 micrograms (mcg) of folic acid every day if you become pregnant.  Ask for birth control (contraception) if you want to prevent pregnancy. Osteoporosis and menopause Osteoporosis is a disease in which the bones lose minerals and strength with aging. This can result in bone fractures. If you are 65 years old or older, or if you are at risk for osteoporosis and fractures, ask your health care provider if you should:  Be screened for bone loss.  Take a calcium or vitamin D supplement to lower your risk of fractures.  Be given hormone replacement therapy (HRT) to treat symptoms of menopause. Follow these instructions at home: Lifestyle  Do not use any products that contain nicotine or tobacco, such as cigarettes, e-cigarettes, and chewing tobacco. If you need help quitting, ask your health care provider.  Do not use street drugs.  Do not share needles.  Ask your health care provider for help if you need support or information about quitting drugs. Alcohol use  Do not drink alcohol if: ? Your health care provider tells you not to drink. ? You are pregnant, may be pregnant, or are planning to become pregnant.  If you drink alcohol: ? Limit how much you use to 0-1 drink a day. ? Limit intake if you are breastfeeding.  Be aware of how much alcohol is in your drink. In the [place] one drink equals one 12 oz bottle of beer (355 mL), one 5 oz glass of wine (148 mL), or one 1 oz glass of hard liquor (44 mL). General instructions  Schedule regular health, dental, and eye exams.  Stay current with your vaccines.  Tell your health care provider if: ? You often feel depressed. ? You have ever been abused or do not feel safe at home. Summary  Adopting a healthy lifestyle and getting preventive care are important in promoting health and wellness.  Follow your health care provider's instructions about healthy  diet, exercising, and getting tested or screened for diseases.  Follow your health care provider's instructions on monitoring your cholesterol and blood pressure. This information is not intended to replace advice given to you by your health care provider. Make sure you discuss any questions you have with your health care provider. Document Revised: 02/23/2018 Document Reviewed: 02/23/2018 Elsevier Patient Education  2020 Elsevier Inc.  

## 2020-01-15 LAB — COMPREHENSIVE METABOLIC PANEL
AG Ratio: 2.4 (calc) (ref 1.0–2.5)
ALT: 15 U/L (ref 6–29)
AST: 14 U/L (ref 10–35)
Albumin: 4.6 g/dL (ref 3.6–5.1)
Alkaline phosphatase (APISO): 61 U/L (ref 37–153)
BUN: 14 mg/dL (ref 7–25)
CO2: 25 mmol/L (ref 20–32)
Calcium: 9.6 mg/dL (ref 8.6–10.4)
Chloride: 106 mmol/L (ref 98–110)
Creat: 0.7 mg/dL (ref 0.50–1.05)
Globulin: 1.9 g/dL (calc) (ref 1.9–3.7)
Glucose, Bld: 77 mg/dL (ref 65–99)
Potassium: 4.2 mmol/L (ref 3.5–5.3)
Sodium: 141 mmol/L (ref 135–146)
Total Bilirubin: 0.6 mg/dL (ref 0.2–1.2)
Total Protein: 6.5 g/dL (ref 6.1–8.1)

## 2020-01-15 LAB — LIPID PANEL
Cholesterol: 202 mg/dL — ABNORMAL HIGH (ref <200)
HDL: 71 mg/dL (ref >=50)
LDL Cholesterol (Calc): 106 mg/dL (calc) — ABNORMAL HIGH
Non-HDL Cholesterol (Calc): 131 mg/dL (calc) — ABNORMAL HIGH (ref <130)
Total CHOL/HDL Ratio: 2.8 (calc) (ref <5.0)
Triglycerides: 133 mg/dL (ref <150)

## 2020-01-15 LAB — CBC WITH DIFFERENTIAL/PLATELET
Absolute Monocytes: 388 cells/uL (ref 200–950)
Basophils Absolute: 61 cells/uL (ref 0–200)
Basophils Relative: 1.2 %
Eosinophils Absolute: 71 cells/uL (ref 15–500)
Eosinophils Relative: 1.4 %
HCT: 40 % (ref 35.0–45.0)
Hemoglobin: 13.3 g/dL (ref 11.7–15.5)
Lymphs Abs: 2484 cells/uL (ref 850–3900)
MCH: 30.6 pg (ref 27.0–33.0)
MCHC: 33.3 g/dL (ref 32.0–36.0)
MCV: 92.2 fL (ref 80.0–100.0)
MPV: 10.5 fL (ref 7.5–12.5)
Monocytes Relative: 7.6 %
Neutro Abs: 2096 cells/uL (ref 1500–7800)
Neutrophils Relative %: 41.1 %
Platelets: 376 10*3/uL (ref 140–400)
RBC: 4.34 10*6/uL (ref 3.80–5.10)
RDW: 11.8 % (ref 11.0–15.0)
Total Lymphocyte: 48.7 %
WBC: 5.1 10*3/uL (ref 3.8–10.8)

## 2020-01-15 LAB — HEMOGLOBIN A1C
Hgb A1c MFr Bld: 5.2 % of total Hgb (ref <5.7)
Mean Plasma Glucose: 103 (calc)
eAG (mmol/L): 5.7 (calc)

## 2020-01-15 LAB — HEPATITIS C ANTIBODY
Hepatitis C Ab: NONREACTIVE
SIGNAL TO CUT-OFF: 0 (ref <1.00)

## 2020-01-17 LAB — CYTOLOGY - PAP
Comment: NEGATIVE
Diagnosis: NEGATIVE
High risk HPV: NEGATIVE

## 2020-02-21 ENCOUNTER — Encounter: Payer: Self-pay | Admitting: Internal Medicine

## 2020-04-03 ENCOUNTER — Other Ambulatory Visit: Payer: Self-pay

## 2020-04-03 ENCOUNTER — Ambulatory Visit (AMBULATORY_SURGERY_CENTER): Payer: Self-pay | Admitting: *Deleted

## 2020-04-03 VITALS — Ht 63.5 in | Wt 114.6 lb

## 2020-04-03 DIAGNOSIS — Z1211 Encounter for screening for malignant neoplasm of colon: Secondary | ICD-10-CM

## 2020-04-03 MED ORDER — PLENVU 140 G PO SOLR: 1.0000 | Freq: Once | ORAL | 0 refills | Status: AC

## 2020-04-03 NOTE — Progress Notes (Addendum)
No egg or soy allergy known to patient  No issues with past sedation with any surgeries or procedures No intubation problems in the past  No FH of Malignant Hyperthermia No diet pills per patient No home 02 use per patient  No blood thinners per patient  Pt denies issues with constipation  No A fib or A flutter  EMMI video to pt or via [place] 19 guidelines implemented in PV today with Pt and RN  Pt is fully vaccinated  for Covid    Virtual pre visit completed. Instructions sent through both MyChart and mail with [employer] coupon attached.  Due to the COVID-19 pandemic we are asking patients to follow certain guidelines.  Pt aware of COVID protocols and [facility] guidelines

## 2020-04-05 ENCOUNTER — Ambulatory Visit
Admission: RE | Admit: 2020-04-05 | Discharge: 2020-04-05 | Disposition: A | Discharge status: Home / Self Care | Payer: 59 | Source: Ambulatory Visit | Attending: Family Medicine | Admitting: Family Medicine

## 2020-04-05 ENCOUNTER — Other Ambulatory Visit: Payer: Self-pay

## 2020-04-05 DIAGNOSIS — Z1231 Encounter for screening mammogram for malignant neoplasm of breast: Secondary | ICD-10-CM

## 2020-04-05 IMAGING — MG MM DIGITAL SCREENING BILAT W/ TOMO AND CAD
8 series · 9 of 24 positions shown · non-contrast
Comparison: Previous exam(s).

CLINICAL DATA: Screening. The technologist was told by the patient
that she has an enlarging skin lesion near her RIGHT areola.

EXAM:
DIGITAL SCREENING BILATERAL MAMMOGRAM WITH TOMO AND CAD

[L CC synth-2D]
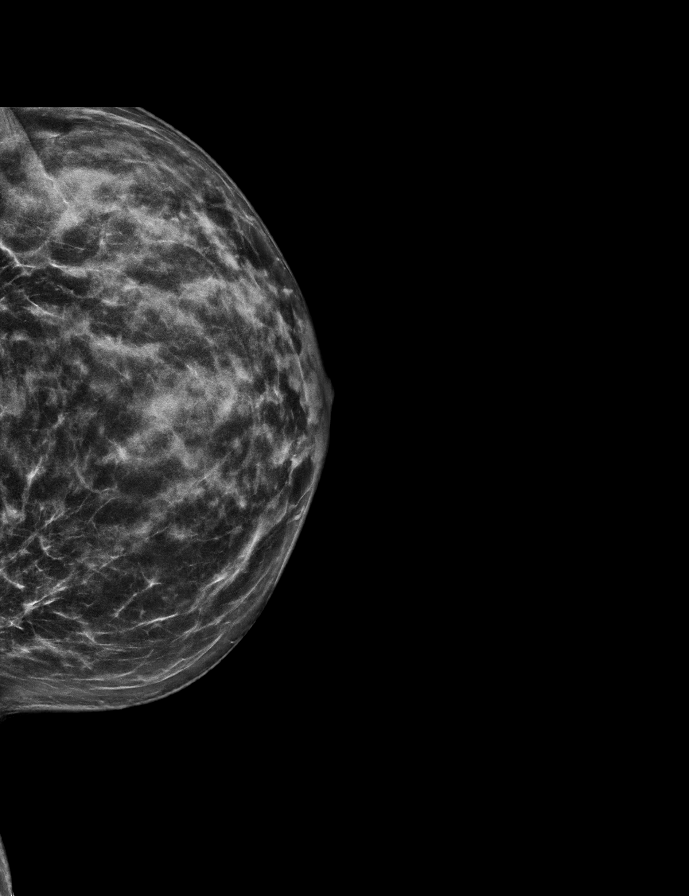

[L MLO synth-2D]
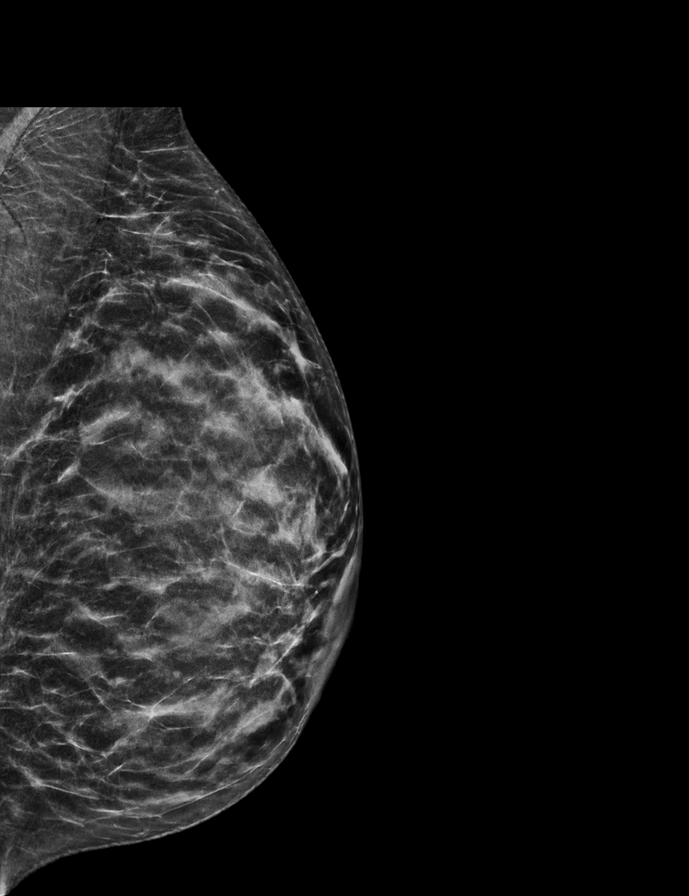

[R MLO synth-2D]
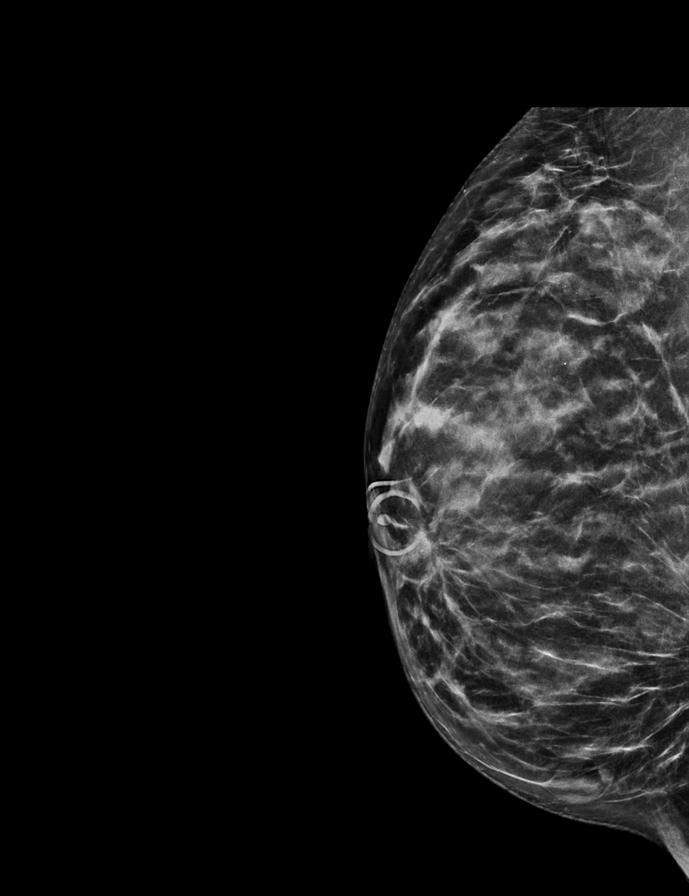

[R CC synth-2D]
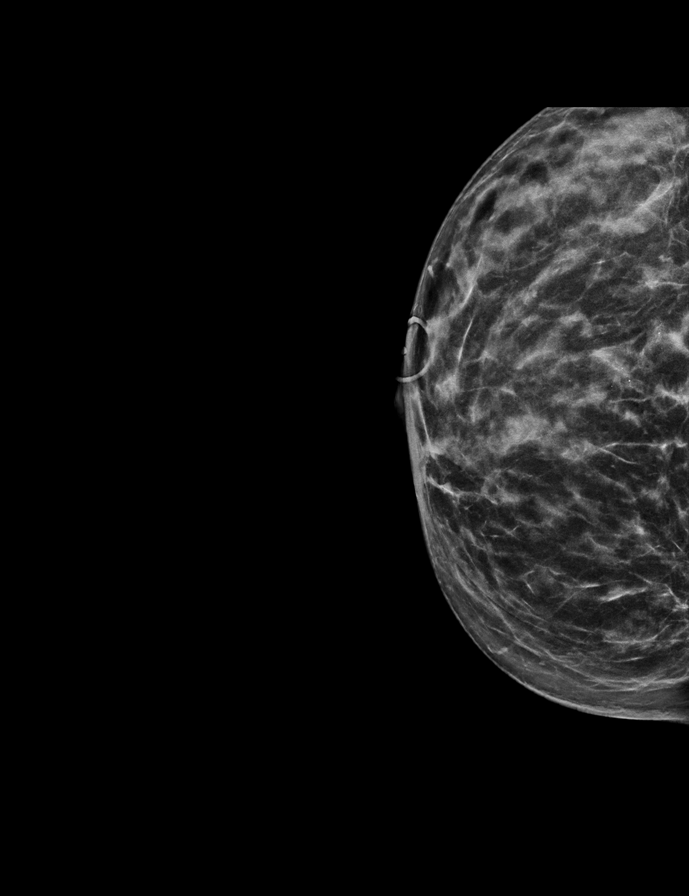

[L MLO tomo · 2 of 50 frames shown]
[frame 17/50]
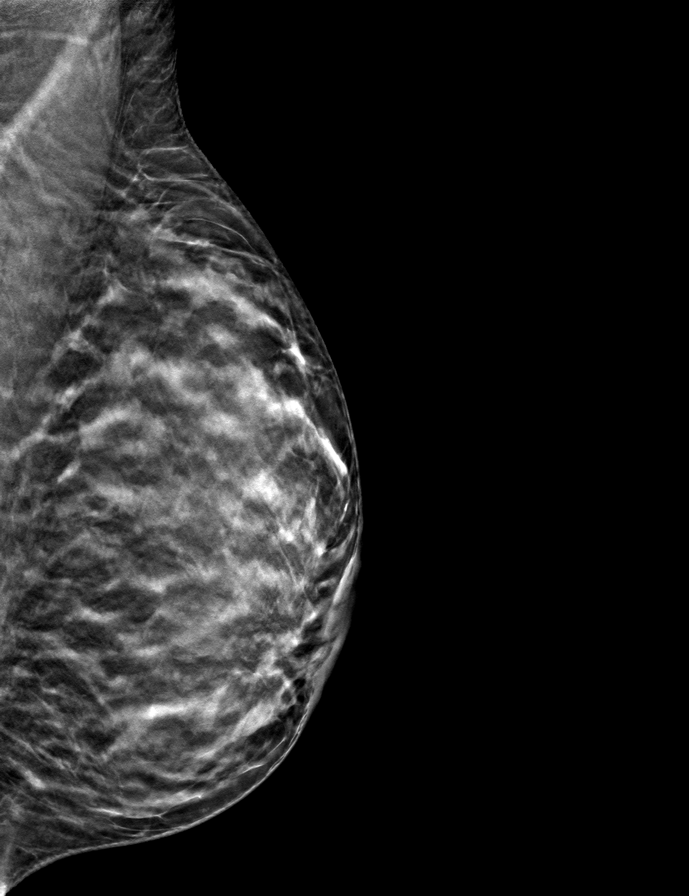
[frame 25/50]
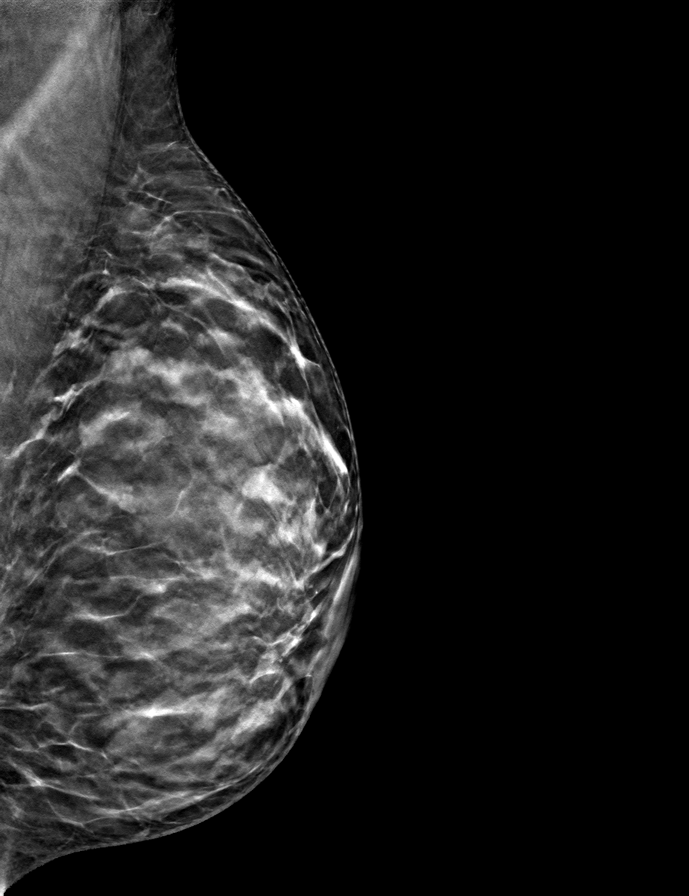

[L CC tomo · tomo slice 27/54.0]
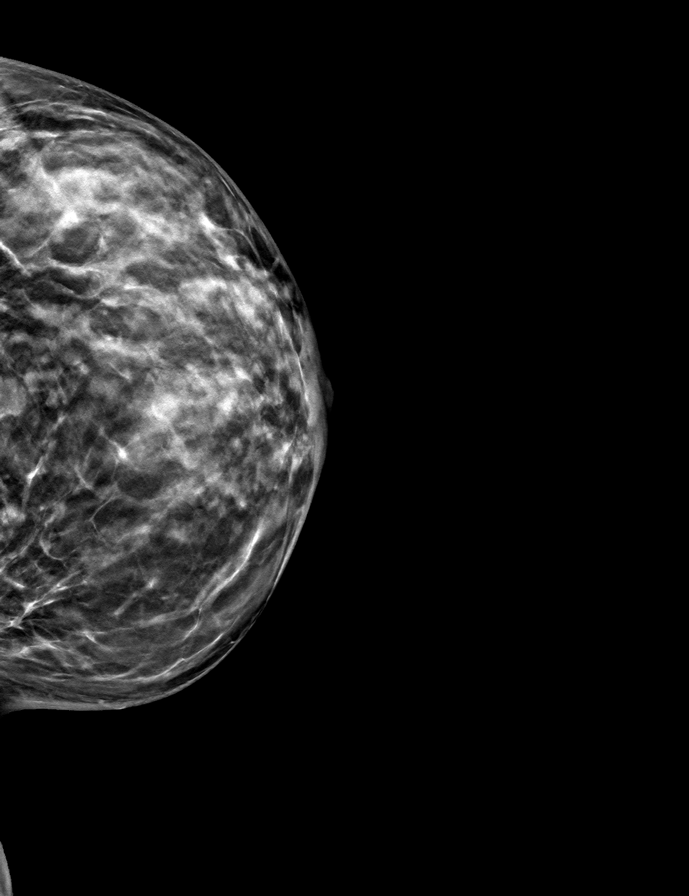

[R CC tomo · tomo slice 28/55.0]
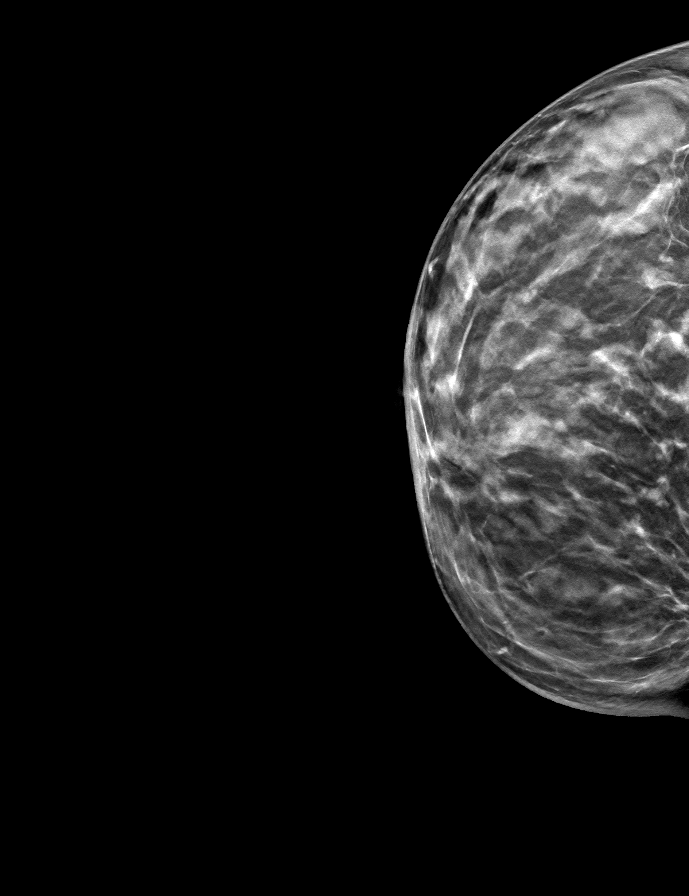

[R MLO tomo · tomo slice 25/50.0]
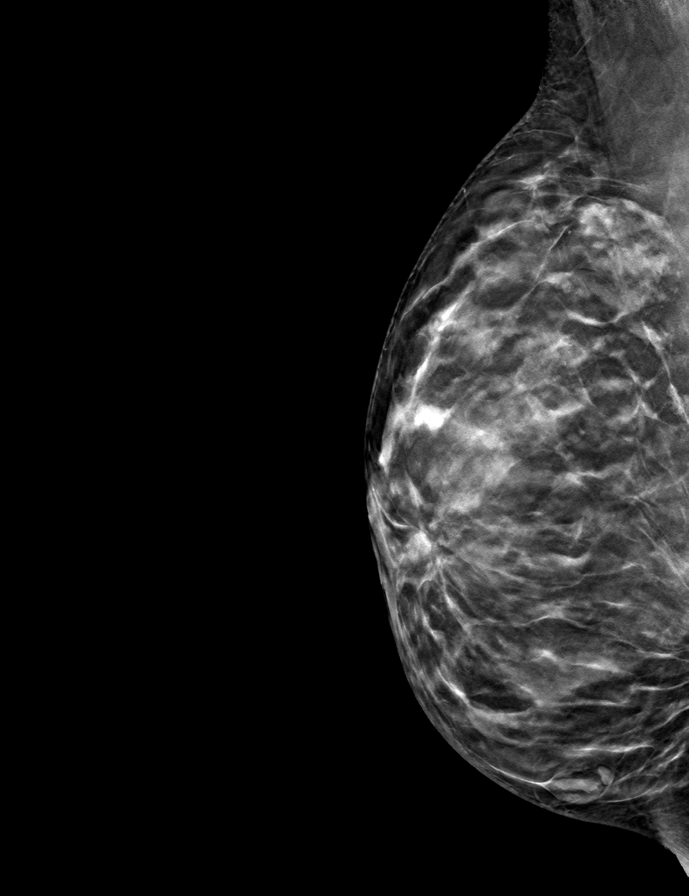

[9 of 24 positions shown; findings below may reference images not displayed]

ACR Breast Density Category c: The breast tissue is heterogeneously
dense, which may obscure small masses.
FINDINGS: There are no findings suspicious for malignancy. The images were
evaluated with computer-aided detection.
IMPRESSION: No mammographic evidence of malignancy. A result letter of this
screening mammogram will be mailed directly to the patient.

RECOMMENDATION:
Recommend clinical follow-up or dermatology consultation for
reported RIGHT breast skin lesion.

Screening mammogram in one year. (Code:[AP])

BI-RADS CATEGORY  1: Negative.

## 2020-04-10 ENCOUNTER — Encounter: Payer: Self-pay | Admitting: Internal Medicine

## 2020-04-18 ENCOUNTER — Ambulatory Visit (AMBULATORY_SURGERY_CENTER): Payer: 59 | Admitting: Internal Medicine

## 2020-04-18 ENCOUNTER — Encounter: Payer: Self-pay | Admitting: Internal Medicine

## 2020-04-18 ENCOUNTER — Other Ambulatory Visit: Payer: Self-pay

## 2020-04-18 VITALS — BP 99/61 | HR 56 | Temp 96.6°F | Resp 19 | Ht 63.5 in | Wt 114.6 lb

## 2020-04-18 DIAGNOSIS — D123 Benign neoplasm of transverse colon: Secondary | ICD-10-CM | POA: Diagnosis not present

## 2020-04-18 DIAGNOSIS — Z1211 Encounter for screening for malignant neoplasm of colon: Secondary | ICD-10-CM | POA: Diagnosis not present

## 2020-04-18 DIAGNOSIS — D125 Benign neoplasm of sigmoid colon: Secondary | ICD-10-CM | POA: Diagnosis not present

## 2020-04-18 DIAGNOSIS — K635 Polyp of colon: Secondary | ICD-10-CM | POA: Diagnosis not present

## 2020-04-18 DIAGNOSIS — D128 Benign neoplasm of rectum: Secondary | ICD-10-CM

## 2020-04-18 MED ORDER — SODIUM CHLORIDE 0.9 % IV SOLN: 500.0000 mL | Freq: Once | INTRAVENOUS | Status: DC

## 2020-04-18 NOTE — Progress Notes (Signed)
Pt's states no medical or surgical changes since previsit or office visit.  VS taken by JD 

## 2020-04-18 NOTE — Patient Instructions (Signed)
Resume previous diet and continue present medications. No aspirin, ibuprofen, naproxen, or other non-steroidal drugs for x2 weeks after polyp removal. Awaiting pathology results. Repeat colonoscopy is recommended for surveillance and that date will be set after pathology results are available.  YOU HAD AN ENDOSCOPIC PROCEDURE TODAY AT [facility]:   Refer to the procedure report that was given to you for any specific questions about what was found during the examination.  If the procedure report does not answer your questions, please call your gastroenterologist to clarify.  If you requested that your care partner not be given the details of your procedure findings, then the procedure report has been included in a sealed envelope for you to review at your convenience later.  YOU SHOULD EXPECT: Some feelings of bloating in the abdomen. Passage of more gas than usual.  Walking can help get rid of the air that was put into your GI tract during the procedure and reduce the bloating. If you had a lower endoscopy (such as a colonoscopy or flexible sigmoidoscopy) you may notice spotting of blood in your stool or on the toilet paper. If you underwent a bowel prep for your procedure, you may not have a normal bowel movement for a few days.  Please Note:  You might notice some irritation and congestion in your nose or some drainage.  This is from the oxygen used during your procedure.  There is no need for concern and it should clear up in a day or so.  SYMPTOMS TO REPORT IMMEDIATELY:   Following lower endoscopy (colonoscopy or flexible sigmoidoscopy):  Excessive amounts of blood in the stool  Significant tenderness or worsening of abdominal pains  Swelling of the abdomen that is new, acute  Fever of 100F or higher  For urgent or emergent issues, a gastroenterologist can be reached at any hour by calling [phone]. Do not use MyChart messaging for urgent concerns.    DIET:  We do  recommend a small meal at first, but then you may proceed to your regular diet.  Drink plenty of fluids but you should avoid alcoholic beverages for 24 hours.  ACTIVITY:  You should plan to take it easy for the rest of today and you should NOT DRIVE or use heavy machinery until tomorrow (because of the sedation medicines used during the test).    FOLLOW UP: Our staff will call the number listed on your records 48-72 hours following your procedure to check on you and address any questions or concerns that you may have regarding the information given to you following your procedure. If we do not reach you, we will leave a message.  We will attempt to reach you two times.  During this call, we will ask if you have developed any symptoms of COVID 19. If you develop any symptoms (ie: fever, flu-like symptoms, shortness of breath, cough etc.) before then, please call [phone].  If you test positive for Covid 19 in the 2 weeks post procedure, please call and report this information to [place].    If any biopsies were taken you will be contacted by phone or by letter within the next 1-3 weeks.  Please call us at [phone] if you have not heard about the biopsies in 3 weeks.    SIGNATURES/CONFIDENTIALITY: You and/or your care partner have signed paperwork which will be entered into your electronic medical record.  These signatures attest to the fact that that the information above on your After Visit  Summary has been reviewed and is understood.  Full responsibility of the confidentiality of this discharge information lies with you and/or your care-partner. 

## 2020-04-18 NOTE — Op Note (Signed)
[facility] Patient Name: April Sexton Procedure Date: 04/18/2020 10:47 AM MRN: [id number] Endoscopist: Jerene Bears , MD Age: 55 Referring MD:  Date of Birth: January 31, 1966 Gender: Female Account #: [id number] Procedure:                Colonoscopy Indications:              Screening for colorectal malignant neoplasm, This                            is the patient's first colonoscopy Medicines:                Monitored Anesthesia Care Procedure:                Pre-Anesthesia Assessment:                           - Prior to the procedure, a History and Physical                            was performed, and patient medications and                            allergies were reviewed. The patient's tolerance of                            previous anesthesia was also reviewed. The risks                            and benefits of the procedure and the sedation                            options and risks were discussed with the patient.                            All questions were answered, and informed consent                            was obtained. Prior Anticoagulants: The patient has                            taken no previous anticoagulant or antiplatelet                            agents. ASA Grade Assessment: II - A patient with                            mild systemic disease. After reviewing the risks                            and benefits, the patient was deemed in                            satisfactory condition to undergo the procedure.  After obtaining informed consent, the colonoscope                            was passed under direct vision. Throughout the                            procedure, the patient's blood pressure, pulse, and                            oxygen saturations were monitored continuously. The                            Olympus PCF-H190DL [id number]) Colonoscope was                            introduced  through the anus and advanced to the                            cecum, identified by appendiceal orifice and                            ileocecal valve. The colonoscopy was performed                            without difficulty. The patient tolerated the                            procedure well. The quality of the bowel                            preparation was good. The ileocecal valve,                            appendiceal orifice, and rectum were photographed. Scope In: 10:56:35 AM Scope Out: 11:16:01 AM Scope Withdrawal Time: 0 hours 13 minutes 35 seconds  Total Procedure Duration: 0 hours 19 minutes 26 seconds  Findings:                 The digital rectal exam was normal.                           A 8 mm polyp was found in the transverse colon. The                            polyp was sessile. The polyp was removed with a                            cold snare. Resection and retrieval were complete.                           A 10 mm polyp was found in the distal sigmoid                            colon. The polyp was pedunculated.  The polyp was                            removed with a hot snare. Resection and retrieval                            were complete.                           A 8 mm polyp was found in the rectum. The polyp was                            semi-pedunculated. The polyp was removed with a hot                            snare. Resection and retrieval were complete.                           A 3 mm polyp was found in the rectum. The polyp was                            sessile. The polyp was removed with a cold snare.                            Resection and retrieval were complete.                           Scattered small-mouthed diverticula were found in                            the sigmoid colon, descending colon, splenic                            flexure and hepatic flexure.                           Internal hemorrhoids were found during                             retroflexion. The hemorrhoids were small. Complications:            No immediate complications. Estimated Blood Loss:     Estimated blood loss: none. Impression:               - One 8 mm polyp in the transverse colon, removed                            with a cold snare. Resected and retrieved.                           - One 10 mm polyp in the distal sigmoid colon,                            removed with a hot snare. Resected and retrieved.                           -  One 8 mm polyp in the rectum, removed with a hot                            snare. Resected and retrieved.                           - One 3 mm polyp in the rectum, removed with a cold                            snare. Resected and retrieved.                           - Diverticulosis in the sigmoid colon, in the                            descending colon, at the splenic flexure and at the                            hepatic flexure.                           - Internal hemorrhoids. Recommendation:           - Patient has a contact number available for                            emergencies. The signs and symptoms of potential                            delayed complications were discussed with the                            patient. Return to normal activities tomorrow.                            Written discharge instructions were provided to the                            patient.                           - Resume previous diet.                           - Continue present medications.                           - No aspirin, ibuprofen, naproxen, or other                            non-steroidal anti-inflammatory drugs for 2 weeks                            after polyp removal.                           -  Await pathology results.                           - Repeat colonoscopy is recommended for                            surveillance. The colonoscopy date will be                            determined after  pathology results from today's                            exam become available for review. Jerene Bears, MD 04/18/2020 11:21:09 AM This report has been signed electronically.

## 2020-04-18 NOTE — Progress Notes (Signed)
Called to room to assist during endoscopic procedure.  Patient ID and intended procedure confirmed with present staff. Received instructions for my participation in the procedure from the performing physician.  

## 2020-04-18 NOTE — Progress Notes (Signed)
Report to PACU, RN, vss, BBS= Clear.  

## 2020-04-22 ENCOUNTER — Telehealth: Payer: Self-pay | Admitting: *Deleted

## 2020-04-22 NOTE — Telephone Encounter (Signed)
  Follow up Call-  Call back number 04/18/2020  Post procedure Call Back phone  # [phone]  Permission to leave phone message Yes     Patient questions:  Do you have a fever, pain , or abdominal swelling? No. Pain Score  0 *  Have you tolerated food without any problems? Yes   Have you been able to return to your normal activities? Yes   Do you have any questions about your discharge instructions: Diet   no Medications  No. Follow up visit  No.  Do you have questions or concerns about your Care? No.  Actions: * If pain score is 4 or above: No action needed, pain <4.   1. Have you developed a fever since your procedure? no  2.   Have you had an respiratory symptoms (SOB or cough) since your procedure? no  3.   Have you tested positive for COVID 19 since your procedure no  4.   Have you had any family members/close contacts diagnosed with the COVID 19 since your procedure?  no   If yes to any of these questions please route to Joylene John, RN and Joella Prince, RN

## 2020-04-22 NOTE — Telephone Encounter (Signed)
  Follow up Call-  Call back number 04/18/2020  Post procedure Call Back phone  # [phone]  Permission to leave phone message Yes     Patient questions:  Message let to call us if necessary.

## 2020-04-26 ENCOUNTER — Encounter: Payer: Self-pay | Admitting: Internal Medicine

## 2020-06-18 ENCOUNTER — Ambulatory Visit: Payer: 59 | Admitting: Family Medicine

## 2020-06-18 ENCOUNTER — Other Ambulatory Visit: Payer: Self-pay

## 2020-06-18 ENCOUNTER — Encounter: Payer: Self-pay | Admitting: Family Medicine

## 2020-06-18 VITALS — BP 113/78 | HR 71 | Temp 98.5°F | Ht 64.0 in | Wt 117.0 lb

## 2020-06-18 DIAGNOSIS — G43809 Other migraine, not intractable, without status migrainosus: Secondary | ICD-10-CM

## 2020-06-18 DIAGNOSIS — G43909 Migraine, unspecified, not intractable, without status migrainosus: Secondary | ICD-10-CM | POA: Insufficient documentation

## 2020-06-18 MED ORDER — SUMATRIPTAN SUCCINATE 100 MG PO TABS: 100.0000 mg | ORAL_TABLET | ORAL | 11 refills | Status: DC | PRN

## 2020-06-18 NOTE — Patient Instructions (Signed)

## 2020-06-18 NOTE — Progress Notes (Signed)
This visit occurred during the SARS-CoV-2 public health emergency.  Safety protocols were in place, including screening questions prior to the visit, additional usage of staff PPE, and extensive cleaning of exam room while observing appropriate contact time as indicated for disinfecting solutions.    April Sexton , Sep 08, 1965, 55 y.o., female MRN: [id number] Patient Care Team    Relationship Specialty Notifications Start End  Ma Hillock, DO PCP - General Family Medicine  01/16/19     Chief Complaint  Patient presents with  . Migraine    Pt c/o HA with photosensitivity that lasted 3 days a week ago; pt is requesting a refill of sumatriptan 100 mg      Subjective: Pt presents for an OV with complaints of migraine headaches. She has a h/o chronic migraines. Fhx of migraines. She had full work up with initial presentation of migraines. Last 5 years she has been migraine free until last week she had a migraine that lasted 3 days. She endorses photosensitivity with headaches. Migraines usually affect the right side of her head.  Depression screen [facility] 2/9 01/12/2020 01/16/2019  Decreased Interest 0 0  Down, Depressed, Hopeless 0 0  PHQ - 2 Score 0 0    Allergies  Allergen Reactions  . 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 Allergy    hayfever/birch tree allergy.   . Arthritis    foot  . Chicken pox   . History of UTI   . Menorrhagia 2018   IUD placed for menorrhagia  . Migraines    Past Surgical History:  Procedure Laterality Date  . BREAST BIOPSY  1995   benign  . BREAST EXCISIONAL BIOPSY Right 1995  . HEMANGIOMA EXCISION  2008   Right Jaw     Family History  Problem Relation Age of Onset  . Arthritis Mother   . Breast cancer Mother 3       Recurrent at 65  . Diabetes Mother   . Hypertension Mother   . Alcohol abuse Father   . Early death Father   . Hyperlipidemia Sister   . Alcohol abuse Brother   . Hypertension Brother   . Colon polyps Brother   . Asthma Paternal Grandfather   . Breast cancer Maternal Aunt   . Breast cancer Cousin        maternal  . Colon cancer Neg Hx   . Esophageal cancer Neg Hx   . Stomach cancer Neg Hx   . Rectal cancer Neg Hx    Allergies as of 06/18/2020      Reactions   Lactose Intolerance (gi) Diarrhea      Medication List       Accurate as of June 18, 2020 11:29 AM. If you have any questions, ask your nurse or doctor.        b complex vitamins capsule Take 1 capsule by mouth daily.   Calcium 200 MG Tabs Take 3 tablets by mouth daily.   co-enzyme Q-10 30 MG capsule Take 100 mg by mouth daily.   Iron 325 (65 Fe) MG Tabs Take 1 tablet by mouth  daily.   levonorgestrel 20 MCG/24HR IUD Commonly known as: MIRENA 1 each by Intrauterine route once.   Magnesium 400 MG Caps Take 1 capsule by mouth daily.   Melatonin 2.5 MG Caps Take 2 capsules by mouth at bedtime.   MULTIVITAL PO Take by mouth.   SUMAtriptan 100 MG tablet Commonly known as: IMITREX Take 1 tablet (100 mg total) by mouth every 2 (two) hours as needed for migraine. May repeat in 2 hours, once,  if headache persists or recurs. Started by: Howard Pouch, DO   Vitamin D3 10 MCG (400 UNIT) Caps Take 2 capsules by mouth daily.       All past medical history, surgical history, allergies, family history, immunizations andmedications were updated in the EMR today and reviewed under the history and medication portions of their EMR.     ROS: Negative, with the exception of above mentioned in HPI   Objective:  BP 113/78   Pulse 71   Temp 98.5 F (36.9 C) (Oral)   Ht 5\' 4"  (1.626 m)   Wt 117 lb (53.1 kg)    SpO2 99%   BMI 20.08 kg/m  Body mass index is 20.08 kg/m. Gen: Afebrile. No acute distress. Nontoxic in appearance, well developed, well nourished.  HENT: AT. [place].MMM Eyes:Pupils Equal Round Reactive to light, Extraocular movements intact,  Conjunctiva without redness, discharge or icterus. Neck/lymp/endocrine: Supple,no lymphadenopathy, no thyromegaly CV: RRR Chest: CTAB  Skin: no rashes, purpura or petechiae.  Neuro:  Normal gait. PERLA. EOMi. Alert. Oriented x3  Psych: Normal affect, dress and demeanor. Normal speech. Normal thought content and judgment.  No exam data present No results found. No results found for this or any previous visit (from the past 24 hour(s)).  Assessment/Plan: April Sexton is a 55 y.o. female present for OV for  migraine without status migrainosus, not intractable Prior history of chrnic migraines, this is her first migraine in many years. Lasted 3 days. She was able to take an old prescription she had of Imitrex, that resolved her headache and she has not had recurrence.  H/o MRI and full neuroligal eval for migraines 30 yrs ago.  Continue imitrex 100 mg PRN F/u prn   Reviewed expectations re: course of current medical issues.  Discussed self-management of symptoms.  Outlined signs and symptoms indicating need for more acute intervention.  Patient verbalized understanding and all questions were answered.  Patient received an After-Visit Summary.    No orders of the defined types were placed in this encounter.  Meds ordered this encounter  Medications  . SUMAtriptan (IMITREX) 100 MG tablet    Sig: Take 1 tablet (100 mg total) by mouth every 2 (two) hours as needed for migraine. May repeat in 2 hours, once,  if headache persists or recurs.    Dispense:  10 tablet    Refill:  11   Referral Orders  No referral(s) requested today     Note is dictated utilizing voice recognition software. Although note has been proof read  prior to signing, occasional typographical errors still can be missed. If any questions arise, please do not hesitate to call for verification.   electronically signed by:  Howard Pouch, DO  [place]

## 2020-09-13 DIAGNOSIS — U071 COVID-19: Secondary | ICD-10-CM

## 2020-09-13 HISTORY — DX: COVID-19: U07.1

## 2021-01-17 ENCOUNTER — Other Ambulatory Visit: Payer: Self-pay

## 2021-01-17 ENCOUNTER — Encounter: Payer: Self-pay | Admitting: Family Medicine

## 2021-01-17 ENCOUNTER — Ambulatory Visit (INDEPENDENT_AMBULATORY_CARE_PROVIDER_SITE_OTHER): Payer: 59 | Admitting: Family Medicine

## 2021-01-17 VITALS — BP 107/70 | HR 75 | Temp 98.4°F | Ht 63.5 in | Wt 119.0 lb

## 2021-01-17 DIAGNOSIS — Z130 Encounter for screening for diseases of the blood and blood-forming organs and certain disorders involving the immune mechanism: Secondary | ICD-10-CM | POA: Diagnosis not present

## 2021-01-17 DIAGNOSIS — Z131 Encounter for screening for diabetes mellitus: Secondary | ICD-10-CM | POA: Diagnosis not present

## 2021-01-17 DIAGNOSIS — Z79899 Other long term (current) drug therapy: Secondary | ICD-10-CM | POA: Diagnosis not present

## 2021-01-17 DIAGNOSIS — Z23 Encounter for immunization: Secondary | ICD-10-CM

## 2021-01-17 DIAGNOSIS — Z0000 Encounter for general adult medical examination without abnormal findings: Secondary | ICD-10-CM

## 2021-01-17 DIAGNOSIS — Z1231 Encounter for screening mammogram for malignant neoplasm of breast: Secondary | ICD-10-CM

## 2021-01-17 DIAGNOSIS — Z803 Family history of malignant neoplasm of breast: Secondary | ICD-10-CM

## 2021-01-17 DIAGNOSIS — G43809 Other migraine, not intractable, without status migrainosus: Secondary | ICD-10-CM

## 2021-01-17 DIAGNOSIS — Z975 Presence of (intrauterine) contraceptive device: Secondary | ICD-10-CM

## 2021-01-17 DIAGNOSIS — Z13220 Encounter for screening for lipoid disorders: Secondary | ICD-10-CM

## 2021-01-17 MED ORDER — SUMATRIPTAN SUCCINATE 100 MG PO TABS
100.0000 mg | ORAL_TABLET | ORAL | 11 refills | Status: DC | PRN
Start: 2021-01-17 — End: 2021-08-15

## 2021-01-17 NOTE — Progress Notes (Signed)
This visit occurred during the SARS-CoV-2 public health emergency.  Safety protocols were in place, including screening questions prior to the visit, additional usage of staff PPE, and extensive cleaning of exam room while observing appropriate contact time as indicated for disinfecting solutions.    Patient ID: April Sexton, female  DOB: [birth year], 55 y.o.   MRN: [id number] Patient Care Team    Relationship Specialty Notifications Start End  Ma Hillock, DO PCP - General Family Medicine  01/16/19     Chief Complaint  Patient presents with   Annual Exam    Pt is not fasting    Subjective: April Sexton is a 55 y.o.  Female  present for CPE . All past medical history, surgical history, allergies, family history, immunizations, medications and social history were updated in the electronic medical record today. All recent labs, ED visits and hospitalizations within the last year were reviewed.  Health maintenance:  Colonoscopy: colonoscopy 04/2020 - rpt 3 yrs. Dr. Hilarie Fredrickson.  Mammogram: completed:03/2020- Mother Fhx breast cancer in her 7s.  BC-[place]> ordered placed today Cervical cancer screening: 12/2019- wnl- neg hpv Immunizations: tdap 12/2018, Influenza UTD 2022(encouraged yearly),  shingrix series completed. Pfizer x3. DTP-diphtheria, tetanus, polio 05/16/2017 Typhoid-Typhim Vi completed 05/16/2017.  Next dose due April 29, 2020. cholera- Dukoral next dose due April 30, 2019 Hepatitis B series completed in 2010. Infectious disease screening: HIV completed/ Hep C  completed DEXA: routine screen.  Assistive device: none Oxygen HYW:VPXT Patient has a Dental home. Hospitalizations/ED visits: reviewed  Depression screen [place] 2/9 01/17/2021 01/12/2020 01/16/2019  Decreased Interest 0 0 0  Down, Depressed, Hopeless 0 0 0  PHQ - 2 Score 0 0 0   No flowsheet data found.  Immunization History  Administered Date(s) Administered   DTP 05/16/2017    DTaP 04/29/1966, 05/28/1966, 06/28/1966, 06/27/1969, 02/27/1980   Hepatitis B 01/22/2009, 02/04/2009, 03/28/2009   Influenza,inj,Quad PF,6+ Mos 12/29/2018   Influenza-Unspecified 01/05/2020, 12/07/2020   MMR 01/02/2019   PFIZER(Purple Top)SARS-COV-2 Vaccination 05/26/2019, 06/16/2019, 01/05/2020, 07/28/2020   Pfizer Covid-19 Vaccine Bivalent Booster 50yr & up 12/08/2020   Tdap 01/02/2019   Typhoid Inactivated 05/16/2017   Varicella 01/02/2019   Zoster Recombinat (Shingrix) 01/16/2019    Past Medical History:  Diagnosis Date   Allergy    hayfever/birch tree allergy.    Arthritis    foot   Chicken pox    COVID 09/2020   History of UTI    Menorrhagia 2018   IUD placed for menorrhagia   Migraines    Allergies  Allergen Reactions   Lactose Intolerance (Gi) Diarrhea   Past Surgical History:  Procedure Laterality Date   BREAST BIOPSY  1995   benign   BREAST EXCISIONAL BIOPSY Right 1995   HEMANGIOMA EXCISION  2008   Right Jaw    Family History  Problem Relation Age of Onset   Arthritis Mother    Breast cancer Mother 564      Recurrent at 742  Diabetes Mother    Hypertension Mother    Alcohol abuse Father    Early death Father    Hyperlipidemia Sister    Alcohol abuse Brother    Hypertension Brother    Colon polyps Brother    Asthma Paternal Grandfather    Breast cancer Maternal Aunt    Breast cancer Cousin        maternal   Colon cancer Neg Hx    Esophageal cancer Neg Hx    Stomach  cancer Neg Hx    Rectal cancer Neg Hx    Social History   Social History Narrative   Marital status/children/pets: married, 1 child   Education/employment: M.S. Chief information officer   Safety:      -smoke alarm in the home:Yes     - wears seatbelt: Yes     - Feels safe in their relationships: Yes    Allergies as of 01/17/2021       Reactions   Lactose Intolerance (gi) Diarrhea        Medication List        Accurate as of January 17, 2021  1:48 PM. If you have  any questions, ask your nurse or doctor.          b complex vitamins capsule Take 1 capsule by mouth daily.   Calcium 200 MG Tabs Take 3 tablets by mouth daily.   co-enzyme Q-10 30 MG capsule Take 100 mg by mouth daily.   Iron 325 (65 Fe) MG Tabs Take 1 tablet by mouth daily.   levonorgestrel 20 MCG/24HR IUD Commonly known as: MIRENA 1 each by Intrauterine route once.   Magnesium 400 MG Caps Take 1 capsule by mouth daily.   Melatonin 2.5 MG Caps Take 2 capsules by mouth at bedtime.   MULTIVITAL PO Take by mouth.   SUMAtriptan 100 MG tablet Commonly known as: IMITREX Take 1 tablet (100 mg total) by mouth every 2 (two) hours as needed for migraine. May repeat in 2 hours, once,  if headache persists or recurs.   Vitamin D3 10 MCG (400 UNIT) Caps Take 2 capsules by mouth daily.        All past medical history, surgical history, allergies, family history, immunizations andmedications were updated in the EMR today and reviewed under the history and medication portions of their EMR.     No results found for this or any previous visit (from the past 2160 hour(s)).  MM 3D SCREEN BREAST BILATERAL  Result Date: 04/05/2020 CLINICAL DATA:  Screening. The technologist was told by the patient that she has an enlarging skin lesion near her RIGHT areola. EXAM: DIGITAL SCREENING BILATERAL MAMMOGRAM WITH TOMO AND CAD COMPARISON:  Previous exam(s). ACR Breast Density Category c: The breast tissue is heterogeneously dense, which may obscure small masses. FINDINGS: There are no findings suspicious for malignancy. The images were evaluated with computer-aided detection. IMPRESSION: No mammographic evidence of malignancy. A result letter of this screening mammogram will be mailed directly to the patient. RECOMMENDATION: Recommend clinical follow-up or dermatology consultation for reported RIGHT breast skin lesion. Screening mammogram in one year. (Code:SM-B-01Y) BI-RADS CATEGORY  1: Negative.  Electronically Signed   By: Margarette Canada M.D.   On: 04/05/2020 10:15     ROS: 14 pt review of systems performed and negative (unless mentioned in an HPI)  Objective: BP 107/70   Pulse 75   Temp 98.4 F (36.9 C) (Oral)   Ht 5' 3.5" (1.613 m)   Wt 119 lb (54 kg)   SpO2 99%   BMI 20.75 kg/m  Gen: Afebrile. No acute distress. Nontoxic in appearance, well-developed, well-nourished,  pleasant female.  HENT: AT. [place]. Bilateral TM visualized and normal in appearance, normal external auditory canal. MMM, no oral lesions, adequate dentition. Bilateral nares within normal limits. Throat without erythema, ulcerations or exudates. no Cough on exam, no hoarseness on exam. Eyes:Pupils Equal Round Reactive to light, Extraocular movements intact,  Conjunctiva without redness, discharge or icterus. Neck/lymp/endocrine: Supple,no lymphadenopathy, no  thyromegaly CV: RRR no murmur, no edema, +2/4 P posterior tibialis pulses.  Chest: CTAB, no wheeze, rhonchi or crackles. normal Respiratory effort. good Air movement. Abd: Soft. flat. NTND. BS present. no Masses palpated. No hepatosplenomegaly. No rebound tenderness or guarding. Skin: no rashes, purpura or petechiae. Warm and well-perfused. Skin intact. Neuro/Msk:  Normal gait. PERLA. EOMi. Alert. Oriented x3.  Cranial nerves II through XII intact. Muscle strength 5/5 upper/lower extremity. DTRs equal bilaterally. Psych: Normal affect, dress and demeanor. Normal speech. Normal thought content and judgment.   No results found.  Assessment/plan: April Sexton is a 55 y.o. female present for CPE Screening for deficiency anemia - CBC Diabetes mellitus screening - Hemoglobin A1c Lipid screening - Lipid panel Breast cancer screening by mammogram/Family history of breast cancer in first degree relative - MM 3D SCREEN BREAST BILATERAL; Future  migraine without status migrainosus, not intractable/Encounter for long-term current use of  medication - Comprehensive metabolic panel - imitrex prn  Routine general medical examination at a health care facility Colonoscopy: colonoscopy 04/2020 - rpt 3 yrs. Dr. Hilarie Fredrickson.  Mammogram: completed:03/2020- Mother Fhx breast cancer in her 94s.  BC-[place]> ordered placed today Cervical cancer screening: 12/2019- wnl- neg hpv Immunizations: tdap 12/2018, Influenza UTD 2022(encouraged yearly),  shingrix series completed. Pfizer x3. DTP-diphtheria, tetanus, polio 05/16/2017 Typhoid-Typhim Vi completed 05/16/2017.  Next dose due April 29, 2020. cholera- Dukoral next dose due April 30, 2019 Hepatitis B series completed in 2010. Infectious disease screening: HIV completed/ Hep C  completed DEXA: routine screen.  Patient was encouraged to exercise greater than 150 minutes a week. Patient was encouraged to choose a diet filled with fresh fruits and vegetables, and lean meats. AVS provided to patient today for education/recommendation on gender specific health and safety maintenance.  Return in about 1 year (around 01/19/2022) for CPE (30 min).  Orders Placed This Encounter  Procedures   MM 3D SCREEN BREAST BILATERAL   Varicella-zoster vaccine IM   CBC   Comprehensive metabolic panel   Hemoglobin A1c   Lipid panel   Meds ordered this encounter  Medications   SUMAtriptan (IMITREX) 100 MG tablet    Sig: Take 1 tablet (100 mg total) by mouth every 2 (two) hours as needed for migraine. May repeat in 2 hours, once,  if headache persists or recurs.    Dispense:  10 tablet    Refill:  11   Referral Orders  No referral(s) requested today     Electronically signed by: Howard Pouch, [place]

## 2021-01-17 NOTE — Patient Instructions (Signed)
°Great to see you today.  °I have refilled the medication(s) we provide.  ° °If labs were collected, we will inform you of lab results once received either by echart message or telephone call.  ° - echart message- for normal results that have been seen by the patient already.  ° - telephone call: abnormal results or if patient has not viewed results in their echart. ° °Health Maintenance, Female °Adopting a healthy lifestyle and getting preventive care are important in promoting health and wellness. Ask your health care provider about: °The right schedule for you to have regular tests and exams. °Things you can do on your own to prevent diseases and keep yourself healthy. °What should I know about diet, weight, and exercise? °Eat a healthy diet ° °Eat a diet that includes plenty of vegetables, fruits, low-fat dairy products, and lean protein. °Do not eat a lot of foods that are high in solid fats, added sugars, or sodium. °Maintain a healthy weight °Body mass index (BMI) is used to identify weight problems. It estimates body fat based on height and weight. Your health care provider can help determine your BMI and help you achieve or maintain a healthy weight. °Get regular exercise °Get regular exercise. This is one of the most important things you can do for your health. Most adults should: °Exercise for at least 150 minutes each week. The exercise should increase your heart rate and make you sweat (moderate-intensity exercise). °Do strengthening exercises at least twice a week. This is in addition to the moderate-intensity exercise. °Spend less time sitting. Even light physical activity can be beneficial. °Watch cholesterol and blood lipids °Have your blood tested for lipids and cholesterol at 55 years of age, then have this test every 5 years. °Have your cholesterol levels checked more often if: °Your lipid or cholesterol levels are high. °You are older than 55 years of age. °You are at high risk for heart  disease. °What should I know about cancer screening? °Depending on your health history and family history, you may need to have cancer screening at various ages. This may include screening for: °Breast cancer. °Cervical cancer. °Colorectal cancer. °Skin cancer. °Lung cancer. °What should I know about heart disease, diabetes, and high blood pressure? °Blood pressure and heart disease °High blood pressure causes heart disease and increases the risk of stroke. This is more likely to develop in people who have high blood pressure readings or are overweight. °Have your blood pressure checked: °Every 3-5 years if you are 18-39 years of age. °Every year if you are 40 years old or older. °Diabetes °Have regular diabetes screenings. This checks your fasting blood sugar level. Have the screening done: °Once every three years after age 40 if you are at a normal weight and have a low risk for diabetes. °More often and at a younger age if you are overweight or have a high risk for diabetes. °What should I know about preventing infection? °Hepatitis B °If you have a higher risk for hepatitis B, you should be screened for this virus. Talk with your health care provider to find out if you are at risk for hepatitis B infection. °Hepatitis C °Testing is recommended for: °Everyone born from 1945 through 1965. °Anyone with known risk factors for hepatitis C. °Sexually transmitted infections (STIs) °Get screened for STIs, including gonorrhea and chlamydia, if: °You are sexually active and are younger than 55 years of age. °You are older than 55 years of age and your health care provider   tells you that you are at risk for this type of infection. °Your sexual activity has changed since you were last screened, and you are at increased risk for chlamydia or gonorrhea. Ask your health care provider if you are at risk. °Ask your health care provider about whether you are at high risk for HIV. Your health care provider may recommend a  prescription medicine to help prevent HIV infection. If you choose to take medicine to prevent HIV, you should first get tested for HIV. You should then be tested every 3 months for as long as you are taking the medicine. °Pregnancy °If you are about to stop having your period (premenopausal) and you may become pregnant, seek counseling before you get pregnant. °Take 400 to 800 micrograms (mcg) of folic acid every day if you become pregnant. °Ask for birth control (contraception) if you want to prevent pregnancy. °Osteoporosis and menopause °Osteoporosis is a disease in which the bones lose minerals and strength with aging. This can result in bone fractures. If you are 65 years old or older, or if you are at risk for osteoporosis and fractures, ask your health care provider if you should: °Be screened for bone loss. °Take a calcium or vitamin D supplement to lower your risk of fractures. °Be given hormone replacement therapy (HRT) to treat symptoms of menopause. °Follow these instructions at home: °Alcohol use °Do not drink alcohol if: °Your health care provider tells you not to drink. °You are pregnant, may be pregnant, or are planning to become pregnant. °If you drink alcohol: °Limit how much you have to: °0-1 drink a day. °Know how much alcohol is in your drink. In the [place] one drink equals one 12 oz bottle of beer (355 mL), one 5 oz glass of wine (148 mL), or one 1½ oz glass of hard liquor (44 mL). °Lifestyle °Do not use any products that contain nicotine or tobacco. These products include cigarettes, chewing tobacco, and vaping devices, such as e-cigarettes. If you need help quitting, ask your health care provider. °Do not use street drugs. °Do not share needles. °Ask your health care provider for help if you need support or information about quitting drugs. °General instructions °Schedule regular health, dental, and eye exams. °Stay current with your vaccines. °Tell your health care provider if: °You often  feel depressed. °You have ever been abused or do not feel safe at home. °Summary °Adopting a healthy lifestyle and getting preventive care are important in promoting health and wellness. °Follow your health care provider's instructions about healthy diet, exercising, and getting tested or screened for diseases. °Follow your health care provider's instructions on monitoring your cholesterol and blood pressure. °This information is not intended to replace advice given to you by your health care provider. Make sure you discuss any questions you have with your health care provider. °Document Revised: 07/22/2020 Document Reviewed: 07/22/2020 °Elsevier Patient Education © 2022 [employer]. ° °

## 2021-01-18 LAB — LIPID PANEL
Cholesterol: 204 mg/dL — ABNORMAL HIGH (ref <200)
HDL: 83 mg/dL (ref >=50)
LDL Cholesterol (Calc): 98 mg/dL (calc)
Non-HDL Cholesterol (Calc): 121 mg/dL (calc) (ref <130)
Total CHOL/HDL Ratio: 2.5 (calc) (ref <5.0)
Triglycerides: 125 mg/dL (ref <150)

## 2021-01-18 LAB — COMPREHENSIVE METABOLIC PANEL
AG Ratio: 2.3 (calc) (ref 1.0–2.5)
ALT: 14 U/L (ref 6–29)
AST: 15 U/L (ref 10–35)
Albumin: 4.5 g/dL (ref 3.6–5.1)
Alkaline phosphatase (APISO): 59 U/L (ref 37–153)
BUN: 12 mg/dL (ref 7–25)
CO2: 28 mmol/L (ref 20–32)
Calcium: 9.7 mg/dL (ref 8.6–10.4)
Chloride: 104 mmol/L (ref 98–110)
Creat: 0.67 mg/dL (ref 0.50–1.03)
Globulin: 2 g/dL (calc) (ref 1.9–3.7)
Glucose, Bld: 105 mg/dL — ABNORMAL HIGH (ref 65–99)
Potassium: 4.1 mmol/L (ref 3.5–5.3)
Sodium: 141 mmol/L (ref 135–146)
Total Bilirubin: 0.6 mg/dL (ref 0.2–1.2)
Total Protein: 6.5 g/dL (ref 6.1–8.1)

## 2021-01-18 LAB — CBC
HCT: 40 % (ref 35.0–45.0)
Hemoglobin: 13.4 g/dL (ref 11.7–15.5)
MCH: 30.9 pg (ref 27.0–33.0)
MCHC: 33.5 g/dL (ref 32.0–36.0)
MCV: 92.4 fL (ref 80.0–100.0)
MPV: 10.6 fL (ref 7.5–12.5)
Platelets: 339 10*3/uL (ref 140–400)
RBC: 4.33 10*6/uL (ref 3.80–5.10)
RDW: 11.9 % (ref 11.0–15.0)
WBC: 5.1 10*3/uL (ref 3.8–10.8)

## 2021-01-18 LAB — HEMOGLOBIN A1C
Hgb A1c MFr Bld: 5.1 % of total Hgb (ref <5.7)
Mean Plasma Glucose: 100 mg/dL
eAG (mmol/L): 5.5 mmol/L

## 2021-01-20 ENCOUNTER — Telehealth: Payer: Self-pay | Admitting: Family Medicine

## 2021-01-20 MED ORDER — DEBROX 6.5 % OT SOLN: 5.0000 [drp] | Freq: Two times a day (BID) | OTIC | 0 refills | Status: DC

## 2021-01-20 NOTE — Telephone Encounter (Signed)
Prescribed Debrox for patient.  She will use as directed 1 to 2 weeks and if still needing assistance with cerumen removal make an appointment with provider.

## 2021-02-03 ENCOUNTER — Encounter: Payer: Self-pay | Admitting: Family

## 2021-02-03 ENCOUNTER — Other Ambulatory Visit: Payer: Self-pay

## 2021-02-03 ENCOUNTER — Ambulatory Visit: Payer: 59 | Admitting: Family

## 2021-02-03 VITALS — BP 110/78 | HR 79 | Temp 97.9°F | Ht 63.5 in | Wt 121.2 lb

## 2021-02-03 DIAGNOSIS — H6121 Impacted cerumen, right ear: Secondary | ICD-10-CM | POA: Diagnosis not present

## 2021-02-03 NOTE — Progress Notes (Signed)
Acute Office Visit  Subjective:    Patient ID: April Sexton, female    DOB: 10/14/1965, 55 y.o.   MRN: [id number]  Chief Complaint  Patient presents with   Ear Pain    Pt c/o of right ear pain and earwax buildup. Wants an ear irrigation if possible    HPI Patient is in today with concerns of not being able to hear well from her right ear. She reports mild pain. She has a history of cerumen impaction and has began putting debrox ear drops in the right ear. She denies any cough or congestion.    Past Medical History:  Diagnosis Date   Allergy    hayfever/birch tree allergy.    Arthritis    foot   Chicken pox    COVID 09/2020   History of UTI    Menorrhagia 2018   IUD placed for menorrhagia   Migraines     Past Surgical History:  Procedure Laterality Date   BREAST BIOPSY  1995   benign   BREAST EXCISIONAL BIOPSY Right 1995   HEMANGIOMA EXCISION  2008   Right Jaw     Family History  Problem Relation Age of Onset   Arthritis Mother    Breast cancer Mother 67       Recurrent at 71   Diabetes Mother    Hypertension Mother    Alcohol abuse Father    Early death Father    Hyperlipidemia Sister    Alcohol abuse Brother    Hypertension Brother    Colon polyps Brother    Asthma Paternal Grandfather    Breast cancer Maternal Aunt    Breast cancer Cousin        maternal   Colon cancer Neg Hx    Esophageal cancer Neg Hx    Stomach cancer Neg Hx    Rectal cancer Neg Hx     Social History   Socioeconomic History   Marital status: Married    Spouse name: Not on file   Number of children: Not on file   Years of education: Not on file   Highest education level: Not on file  Occupational History   Not on file  Tobacco Use   Smoking status: Every Day    Types: E-cigarettes   Smokeless tobacco: Never   Tobacco comments:    quit smoking cigerettes 06/2013>>started vaping nicotine  Vaping Use   Vaping Use: Every day   Start date: 01/15/2013    Substances: Nicotine   Devices: 37m nicotine  Substance and Sexual Activity   Alcohol use: Yes    Comment: Socially    Drug use: Never   Sexual activity: Yes    Partners: Male  Other Topics Concern   Not on file  Social History Narrative   Marital status/children/pets: married, 1 child   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     Calcium 200 MG TABS Take 3 tablets by mouth daily.     carbamide peroxide (DEBROX) 6.5 % OTIC solution Place 5 drops into both ears 2 (two) times daily. 15 mL 0   Cholecalciferol (VITAMIN D3) 10 MCG (400 UNIT) CAPS Take 2 capsules by mouth daily.     co-enzyme Q-10 30 MG capsule Take 100 mg by mouth daily.      Ferrous Sulfate (IRON) 325 (65 Fe) MG TABS Take 1 tablet by mouth daily.     levonorgestrel (MIRENA) 20 MCG/24HR IUD 1 each by Intrauterine route once.     Magnesium 400 MG CAPS Take 1 capsule by mouth daily.     Melatonin 2.5 MG CAPS Take 2 capsules by mouth at bedtime.     Multiple Vitamins-Minerals (MULTIVITAL PO) Take by mouth.     SUMAtriptan (IMITREX) 100 MG tablet Take 1 tablet (100 mg total) by mouth every 2 (two) hours as needed for migraine. May repeat in 2 hours, once,  if headache persists or recurs. 10 tablet 11   Facility-Administered Medications Prior to Visit  Medication Dose Route Frequency Provider Last Rate Last Admin   0.9 %  sodium chloride infusion  500 mL Intravenous Once Pyrtle, Lajuan Lines, MD         Allergies  Allergen Reactions   Lactose Intolerance (Gi) Diarrhea    Review of Systems  Constitutional: Negative.   HENT:  Positive for ear pain.        Ear wax impaction right ear  Eyes: Negative.   Respiratory: Negative.    Cardiovascular: Negative.   Gastrointestinal: Negative.   Allergic/Immunologic: Negative.   Neurological: Negative.   Psychiatric/Behavioral: Negative.    All other systems reviewed and are negative.     Objective:    Physical Exam Vitals and nursing note reviewed.  Constitutional:      Appearance: Normal appearance.  HENT:     Head: Normocephalic.     Left Ear: Tympanic membrane and ear canal normal.     Ears:     Comments: Right ear impacted with cerumen.   Informed consent was obtained and peroxide gel was inserted into the ears bilaterally using the lavage kit the ears were lavaged until clean.Inspection with a cerumen spoon removed residual wax. Patient tolerated the procedure well.  Cardiovascular:     Rate and Rhythm: Normal rate and regular rhythm.  Pulmonary:     Effort: Pulmonary effort is normal.     Breath sounds: Normal breath sounds.  Musculoskeletal:     Cervical back: Normal range of motion and neck supple.  Skin:    General: Skin is warm and dry.  Neurological:     General: No focal deficit present.     Mental Status: She is alert and oriented to person, place, and time.    BP 110/78 (BP Location: Left Arm, Patient Position: Sitting, Cuff Size: Normal)   Pulse 79   Temp 97.9 F (36.6 C) (Temporal)   Ht 5' 3.5" (1.613 m)   Wt 121 lb 3.2 oz (55 kg)   SpO2 98%   BMI 21.13 kg/m  Wt Readings from Last 3 Encounters:  02/03/21 121 lb 3.2 oz (55 kg)  01/17/21 119 lb (54 kg)  06/18/20 117 lb (53.1 kg)    There are no preventive care reminders to display for this patient.  There are no preventive care reminders to display for this patient.   No results found for: TSH Lab  Results  Component Value Date   WBC 5.1  01/17/2021   HGB 13.4 01/17/2021   HCT 40.0 01/17/2021   MCV 92.4 01/17/2021   PLT 339 01/17/2021   Lab Results  Component Value Date   NA 141 01/17/2021   K 4.1 01/17/2021   CO2 28 01/17/2021   GLUCOSE 105 (H) 01/17/2021   BUN 12 01/17/2021   CREATININE 0.67 01/17/2021   BILITOT 0.6 01/17/2021   ALKPHOS 49 01/16/2019   AST 15 01/17/2021   ALT 14 01/17/2021   PROT 6.5 01/17/2021   ALBUMIN 4.4 01/16/2019   CALCIUM 9.7 01/17/2021   GFR 92.11 01/16/2019   Lab Results  Component Value Date   CHOL 204 (H) 01/17/2021   Lab Results  Component Value Date   HDL 83 01/17/2021   Lab Results  Component Value Date   LDLCALC 98 01/17/2021   Lab Results  Component Value Date   TRIG 125 01/17/2021   Lab Results  Component Value Date   CHOLHDL 2.5 01/17/2021   Lab Results  Component Value Date   HGBA1C 5.1 01/17/2021       Assessment & Plan:   Problem List Items Addressed This Visit   None Visit Diagnoses     Hearing loss of right ear due to cerumen impaction    -  Primary      Kerie was seen today for ear pain.  Diagnoses and all orders for this visit:  Hearing loss of right ear due to cerumen impaction   Call with any questions or concerns. Recheck as scheduled and sooner as needed.

## 2021-02-20 ENCOUNTER — Encounter: Payer: Self-pay | Admitting: Family Medicine

## 2021-03-25 ENCOUNTER — Encounter: Payer: Self-pay | Admitting: Obstetrics & Gynecology

## 2021-03-25 ENCOUNTER — Other Ambulatory Visit (HOSPITAL_COMMUNITY)
Admission: RE | Admit: 2021-03-25 | Discharge: 2021-03-25 | Disposition: A | Discharge status: Home / Self Care | Payer: 59 | Source: Ambulatory Visit | Attending: Obstetrics & Gynecology | Admitting: Obstetrics & Gynecology

## 2021-03-25 ENCOUNTER — Ambulatory Visit: Payer: 59 | Admitting: Obstetrics & Gynecology

## 2021-03-25 ENCOUNTER — Other Ambulatory Visit: Payer: Self-pay

## 2021-03-25 VITALS — BP 122/78 | HR 77 | Ht 63.25 in | Wt 120.0 lb

## 2021-03-25 DIAGNOSIS — Z01419 Encounter for gynecological examination (general) (routine) without abnormal findings: Secondary | ICD-10-CM

## 2021-03-25 DIAGNOSIS — Z975 Presence of (intrauterine) contraceptive device: Secondary | ICD-10-CM | POA: Diagnosis not present

## 2021-03-25 NOTE — Progress Notes (Signed)
Keyly Baldonado 08-Oct-1965 [id number]   History:    56 y.o. G1P1L1  Originally from [place].  Moved to [place] in 2019.  Daughter is 72 yo.  RP:  New patient presenting for annual gyn exam   HPI: Well on Mirena IUD x 2018.  No vaginal bleeding.  No Menopausal Sx.  No pelvic pain.  No pain with IC.  Previous Paps normal.  Last Pap 12/2019 Neg.  Breasts normal.  Screening mammo Neg 03/2020, scheduled for 04/07/21.  Colono 04/2020. BMI 21.09.  Good fitness. Health labs with Fam MD.   Past medical history,surgical history, family history and social history were all reviewed and documented in the EPIC chart.  Gynecologic History No LMP recorded. (Menstrual status: IUD).  Obstetric History OB History  Gravida Para Term Preterm AB Living  1 1       1   SAB IAB Ectopic Multiple Live Births               # Outcome Date GA Lbr Len/2nd Weight Sex Delivery Anes PTL Lv  1 Para              ROS: A ROS was performed and pertinent positives and negatives are included in the history.  GENERAL: No fevers or chills. HEENT: No change in vision, no earache, sore throat or sinus congestion. NECK: No pain or stiffness. CARDIOVASCULAR: No chest pain or pressure. No palpitations. PULMONARY: No shortness of breath, cough or wheeze. GASTROINTESTINAL: No abdominal pain, nausea, vomiting or diarrhea, melena or bright red blood per rectum. GENITOURINARY: No urinary frequency, urgency, hesitancy or dysuria. MUSCULOSKELETAL: No joint or muscle pain, no back pain, no recent trauma. DERMATOLOGIC: No rash, no itching, no lesions. ENDOCRINE: No polyuria, polydipsia, no heat or cold intolerance. No recent change in weight. HEMATOLOGICAL: No anemia or easy bruising or bleeding. NEUROLOGIC: No headache, seizures, numbness, tingling or weakness. PSYCHIATRIC: No depression, no loss of interest in normal activity or change in sleep pattern.     Exam:   BP 122/78    Pulse 77    Ht 5' 3.25" (1.607 m)    Wt  120 lb (54.4 kg)    SpO2 97%    BMI 21.09 kg/m   Body mass index is 21.09 kg/m.  General appearance : Well developed well nourished female. No acute distress HEENT: Eyes: no retinal hemorrhage or exudates,  Neck supple, trachea midline, no carotid bruits, no thyroidmegaly Lungs: Clear to auscultation, no rhonchi or wheezes, or rib retractions  Heart: Regular rate and rhythm, no murmurs or gallops Breast:Examined in sitting and supine position were symmetrical in appearance, no palpable masses or tenderness,  no skin retraction, no nipple inversion, no nipple discharge, no skin discoloration, no axillary or supraclavicular lymphadenopathy Abdomen: no palpable masses or tenderness, no rebound or guarding Extremities: no edema or skin discoloration or tenderness  Pelvic: Vulva: Normal             Vagina: No gross lesions or discharge  Cervix: No gross lesions or discharge.  Pap reflex done.  Uterus  AV, normal size, shape and consistency, non-tender and mobile  Adnexa  Without masses or tenderness  Anus: Normal   Assessment/Plan:  56 y.o. female for annual exam   1. Encounter for routine gynecological examination with Papanicolaou smear of cervix Well on Mirena IUD x 2018.  No vaginal bleeding.  No Menopausal Sx.  No pelvic pain.  No pain with IC.  Previous Paps normal.  Last  Pap 12/2019 Neg.  Pap reflex done today.  Breasts normal.  Screening mammo Neg 03/2020, scheduled for 04/07/21.  Colono 04/2020. BMI 21.09.  Good fitness. Health labs with Fam MD. - Cytology - PAP( [facility])  2. IUD (intrauterine device) in place  Well on Mirena IUD x 2018.  IUD in good position.  Will keep this Mirena IUD for 7 years, 2025.  Princess Bruins MD, 11:03 AM 03/25/2021

## 2021-03-27 LAB — CYTOLOGY - PAP

## 2021-03-31 ENCOUNTER — Other Ambulatory Visit: Payer: Self-pay | Admitting: *Deleted

## 2021-03-31 DIAGNOSIS — R87612 Low grade squamous intraepithelial lesion on cytologic smear of cervix (LGSIL): Secondary | ICD-10-CM

## 2021-04-07 ENCOUNTER — Ambulatory Visit
Admission: RE | Admit: 2021-04-07 | Discharge: 2021-04-07 | Disposition: A | Discharge status: Home / Self Care | Payer: 59 | Source: Ambulatory Visit | Attending: Family Medicine | Admitting: Family Medicine

## 2021-04-07 DIAGNOSIS — Z1231 Encounter for screening mammogram for malignant neoplasm of breast: Secondary | ICD-10-CM

## 2021-04-07 IMAGING — MG MM DIGITAL SCREENING BILAT W/ TOMO AND CAD
8 series · 9 of 24 positions shown · non-contrast
Comparison: Previous exam(s).

CLINICAL DATA: Screening.

EXAM:
DIGITAL SCREENING BILATERAL MAMMOGRAM WITH TOMOSYNTHESIS AND CAD
TECHNIQUE: Bilateral screening digital craniocaudal and mediolateral oblique
mammograms were obtained. Bilateral screening digital breast
tomosynthesis was performed. The images were evaluated with
computer-aided detection.

[R MLO synth-2D]
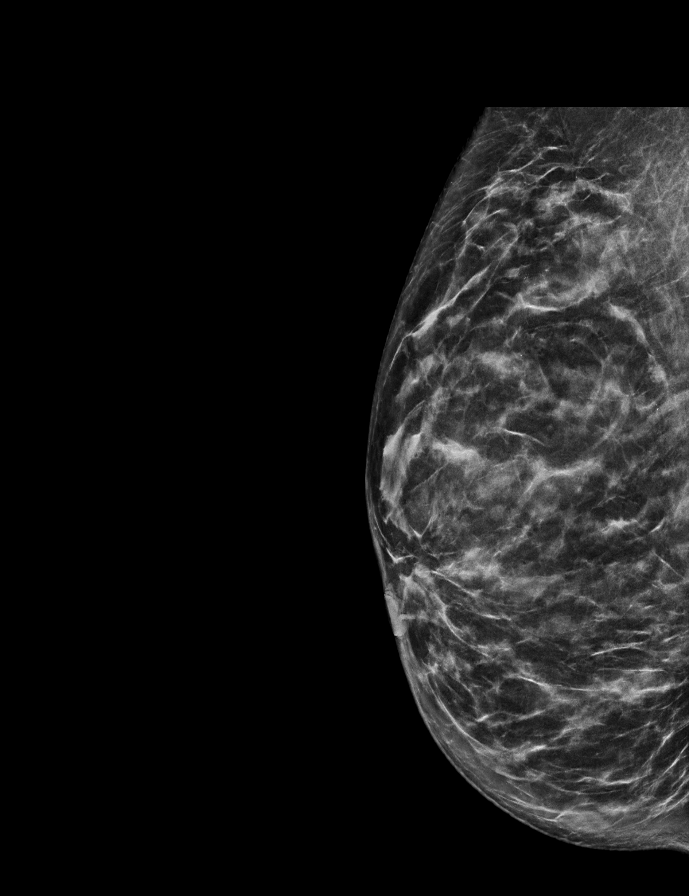

[R CC synth-2D]
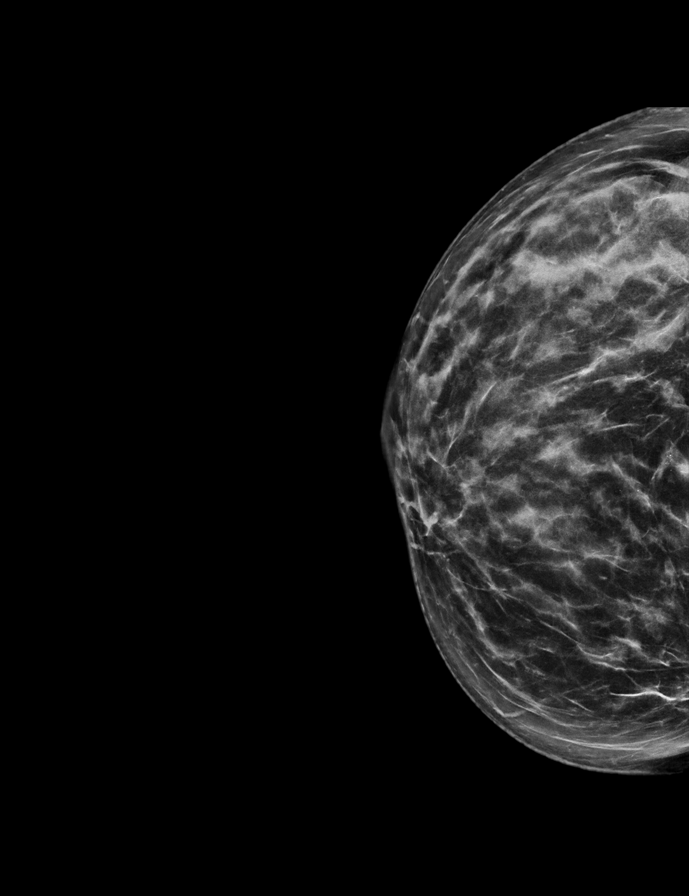

[L CC synth-2D]
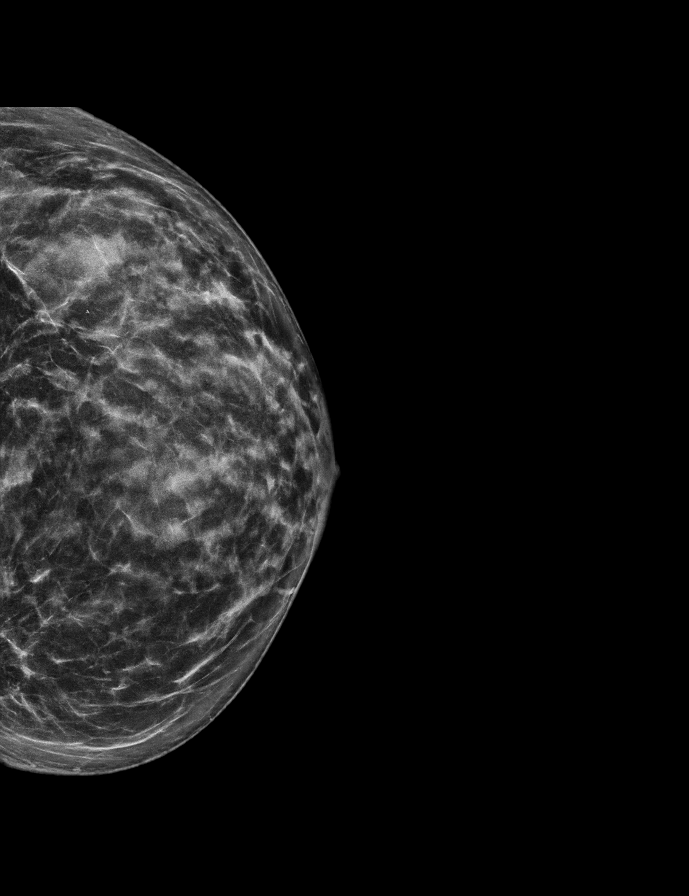

[L MLO synth-2D]
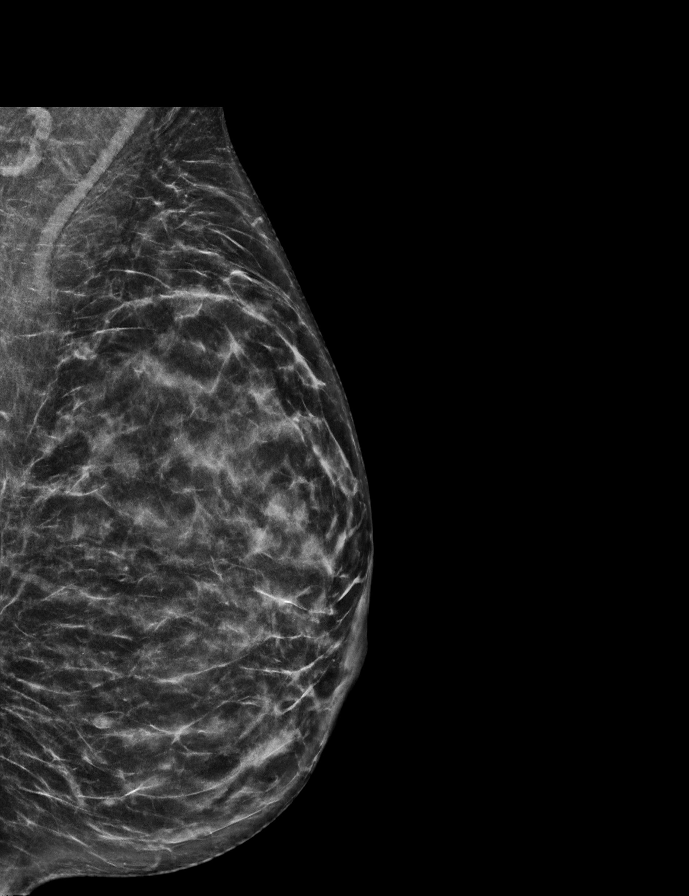

[R CC tomo · 2 of 63 frames shown]
[frame 21/63]
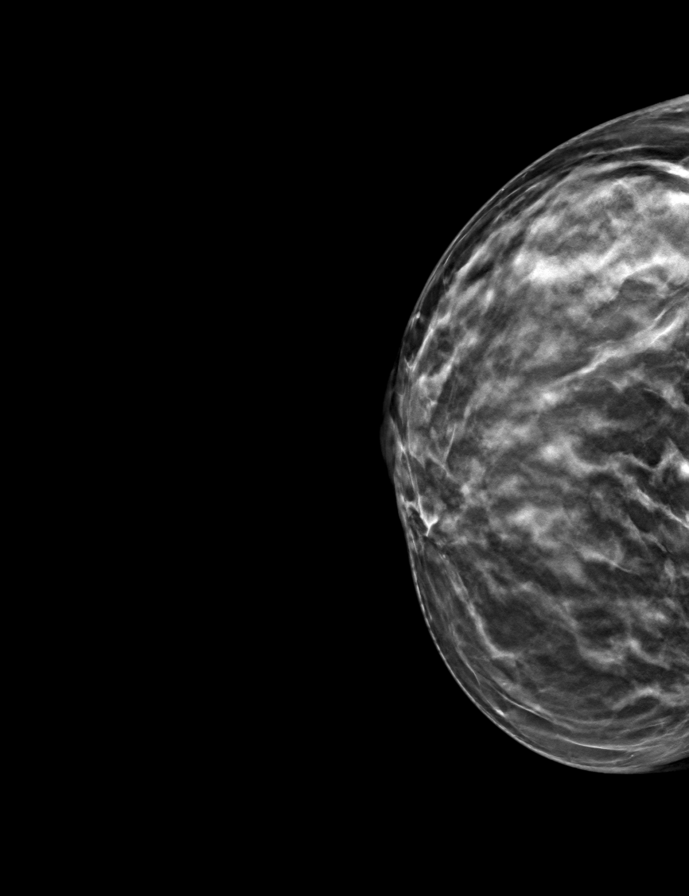
[frame 32/63]
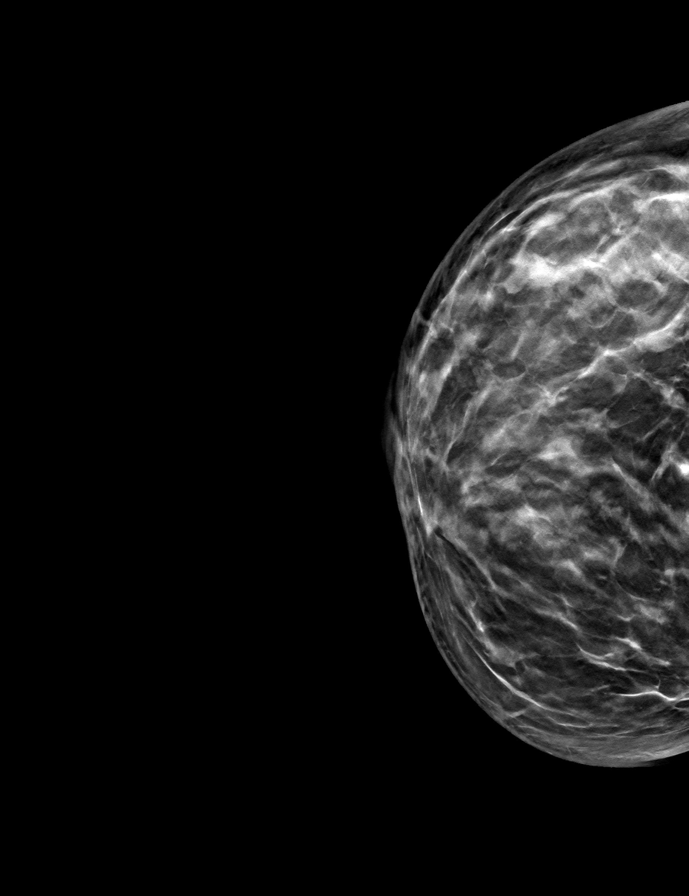

[R MLO tomo · tomo slice 31/60.0]
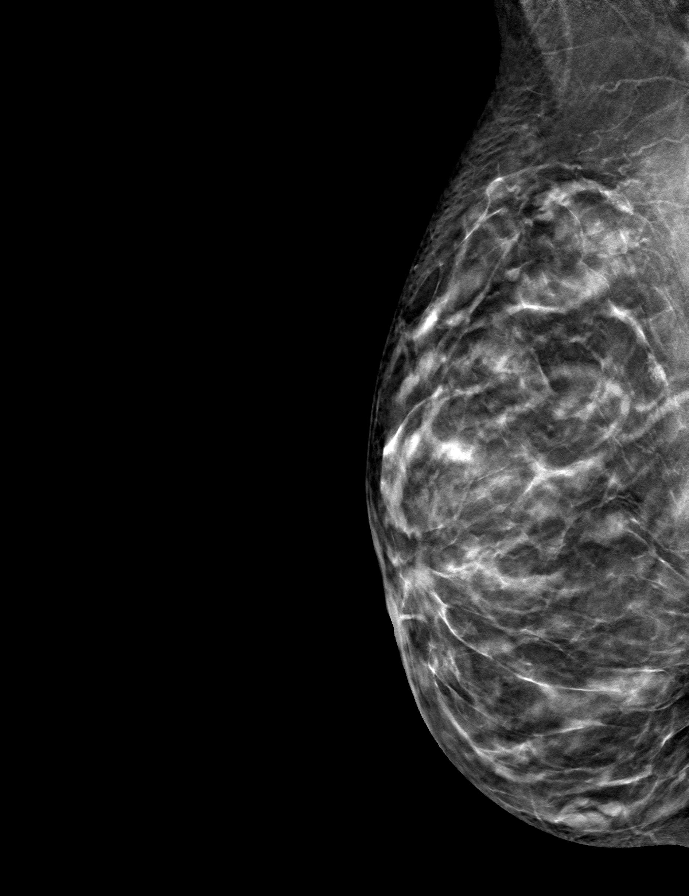

[L MLO tomo · tomo slice 30/59.0]
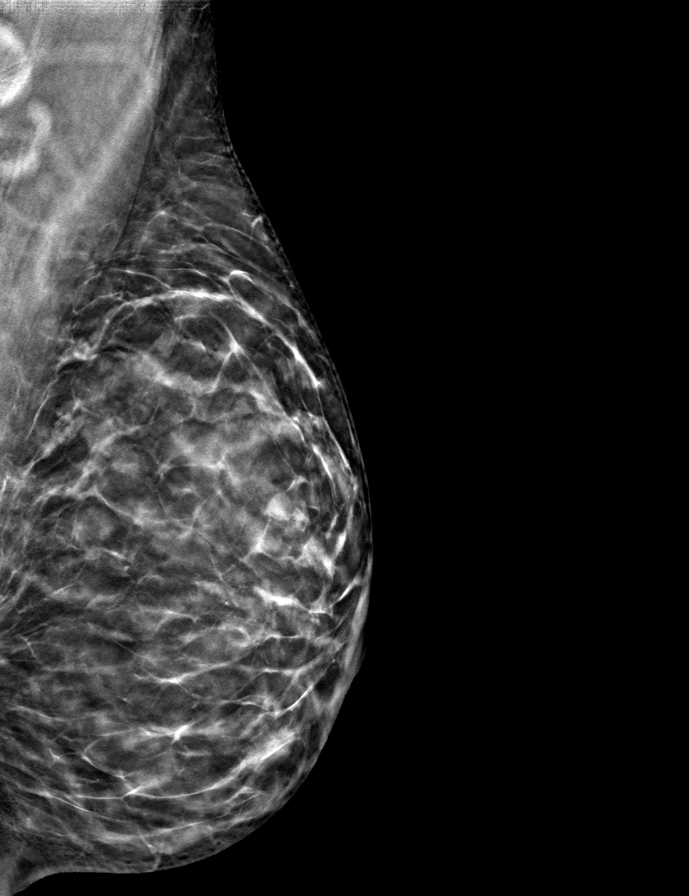

[L CC tomo · tomo slice 31/61.0]
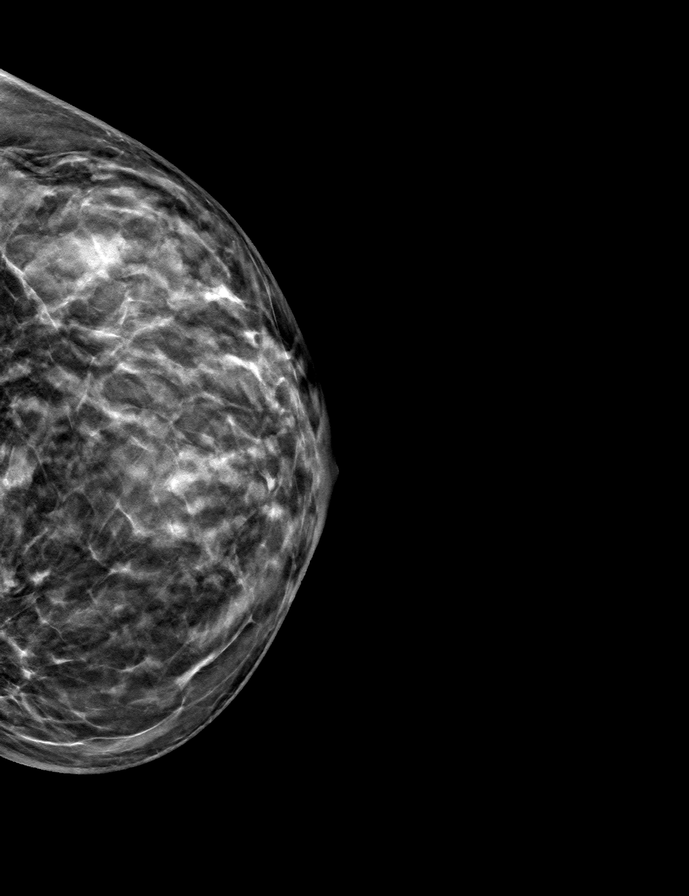

[9 of 24 positions shown; findings below may reference images not displayed]

ACR Breast Density Category c: The breast tissue is heterogeneously
dense, which may obscure small masses.
FINDINGS: In the right breast, calcifications warrant further evaluation with
magnified views. In the left breast, no findings suspicious for
malignancy.
IMPRESSION: Further evaluation is suggested for calcifications in the right
breast.

RECOMMENDATION:
Diagnostic mammogram of the right breast. (Code:[CM])

The patient will be contacted regarding the findings, and additional
imaging will be scheduled.

BI-RADS CATEGORY  0: Incomplete. Need additional imaging evaluation
and/or prior mammograms for comparison.

## 2021-04-08 ENCOUNTER — Other Ambulatory Visit: Payer: Self-pay | Admitting: Family Medicine

## 2021-04-08 ENCOUNTER — Ambulatory Visit: Payer: 59 | Admitting: Family Medicine

## 2021-04-08 DIAGNOSIS — R928 Other abnormal and inconclusive findings on diagnostic imaging of breast: Secondary | ICD-10-CM

## 2021-04-18 ENCOUNTER — Ambulatory Visit: Payer: 59 | Admitting: Family Medicine

## 2021-04-19 ENCOUNTER — Ambulatory Visit
Admission: RE | Admit: 2021-04-19 | Discharge: 2021-04-19 | Disposition: A | Discharge status: Home / Self Care | Payer: 59 | Source: Ambulatory Visit | Attending: Family Medicine | Admitting: Family Medicine

## 2021-04-19 ENCOUNTER — Other Ambulatory Visit: Payer: Self-pay | Admitting: Family Medicine

## 2021-04-19 DIAGNOSIS — R928 Other abnormal and inconclusive findings on diagnostic imaging of breast: Secondary | ICD-10-CM

## 2021-04-19 IMAGING — MG MM DIGITAL DIAGNOSTIC UNILAT*R*
5 series · 6 of 9 positions shown · non-contrast
Comparison: Previous exam(s).

CLINICAL DATA: Screening recall for right breast calcifications.
The patient has strong family history of breast cancer including her
mother, maternal aunt and a cousin who was diagnosed in her 20s.

EXAM:
DIGITAL DIAGNOSTIC UNILATERAL RIGHT MAMMOGRAM
TECHNIQUE: Right digital diagnostic mammography was performed. Mammographic
images were processed with CAD.

[R ML]
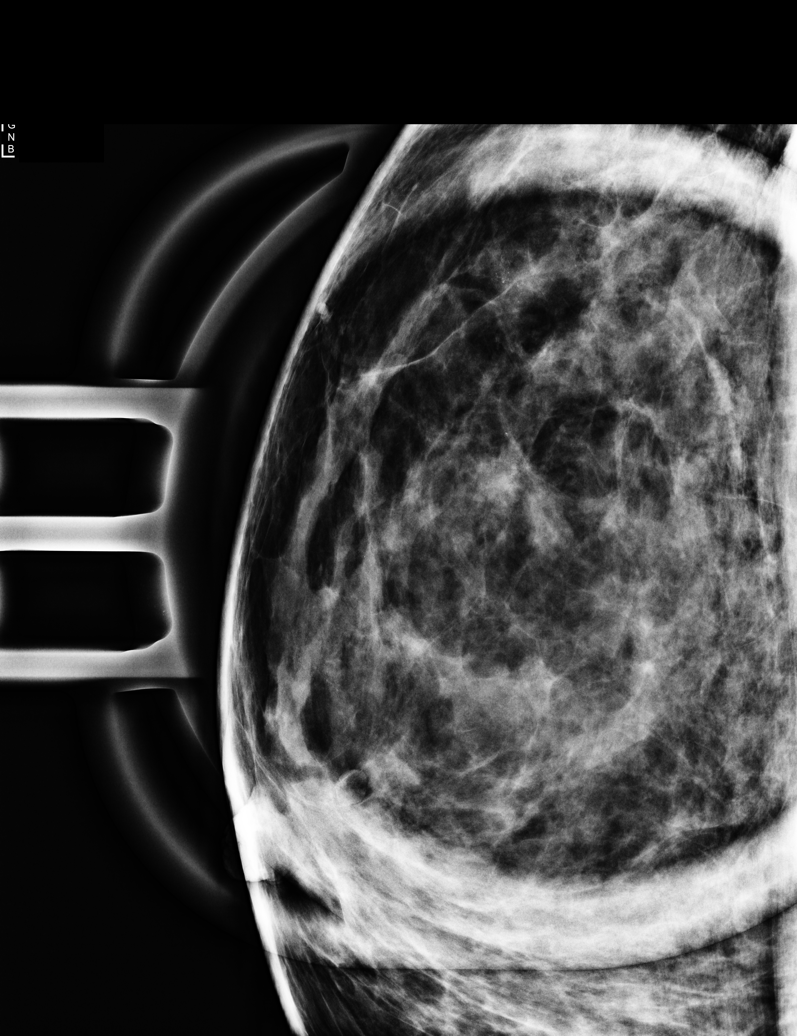

[R CC (1 of 2)]
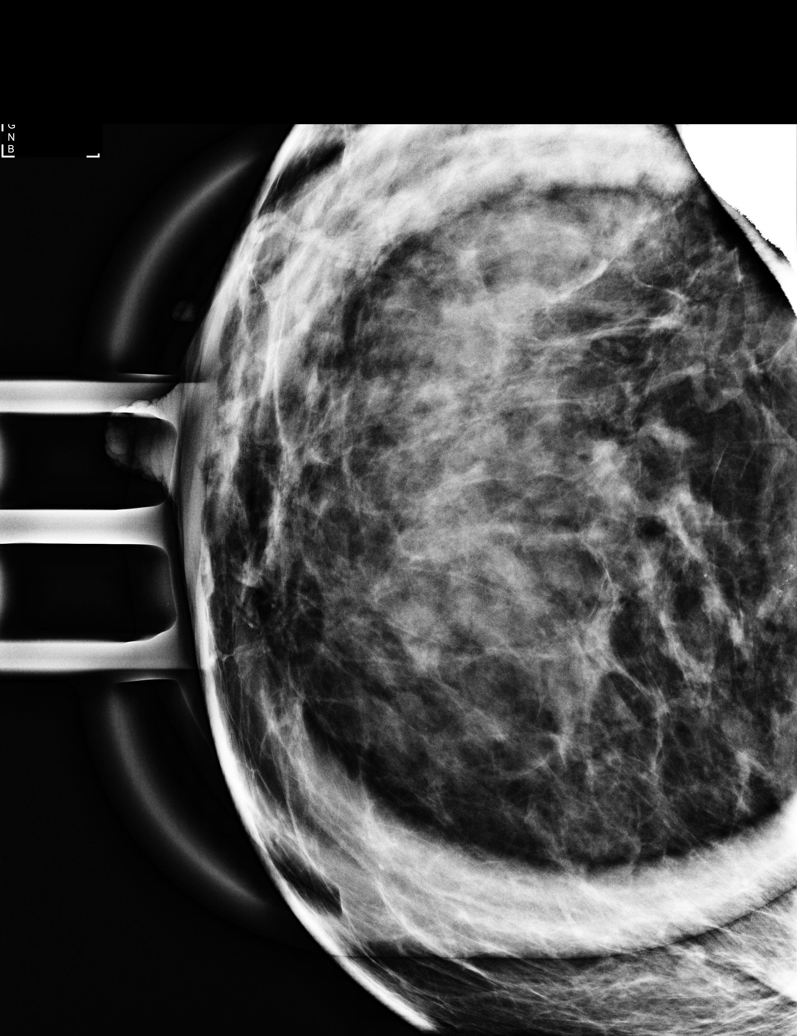

[R CC (2 of 2)]
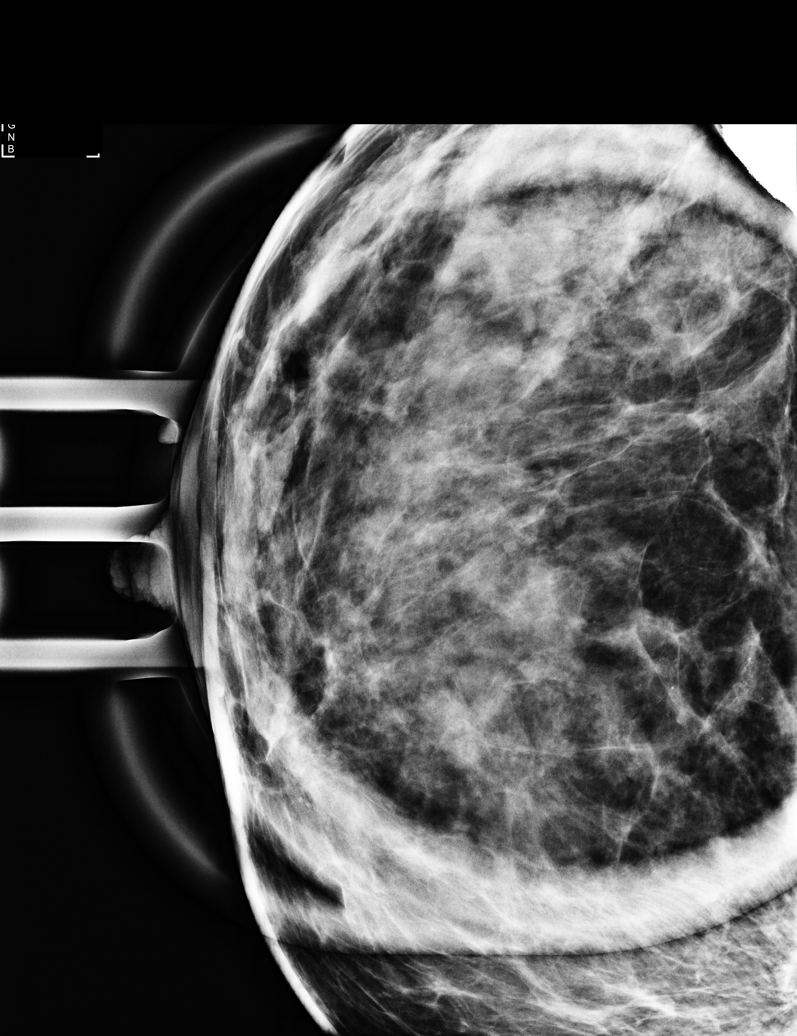

[R ML synth-2D]
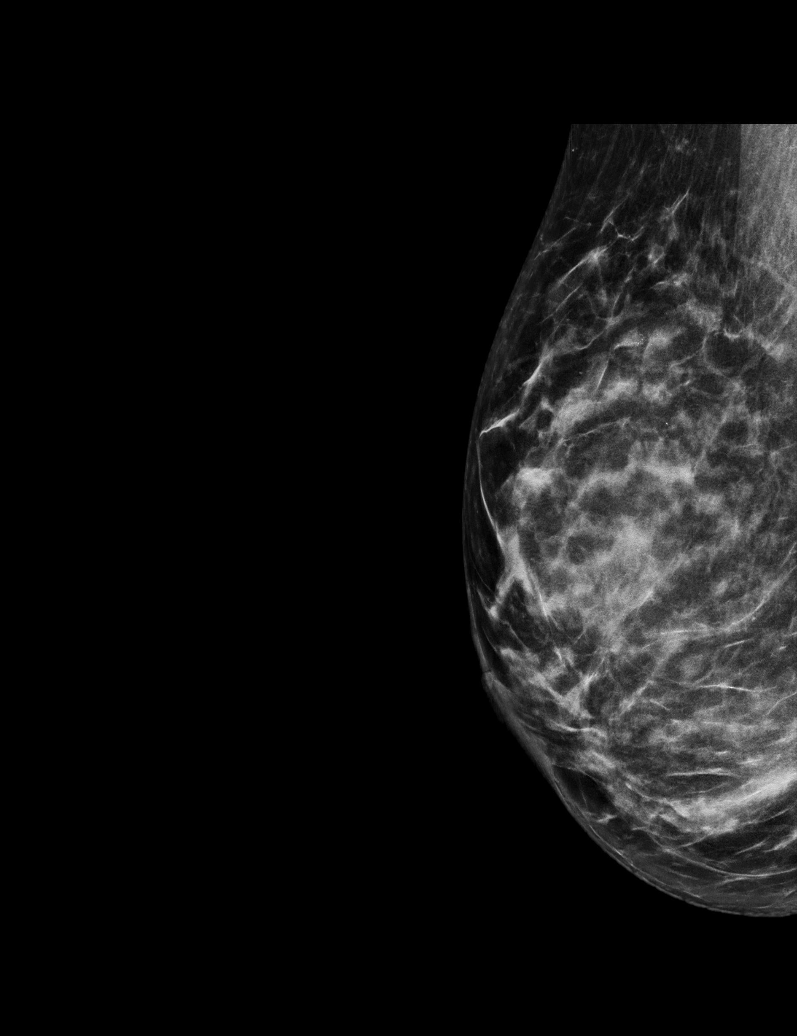

[R ML tomo · 2 of 56 frames shown]
[frame 19/56]
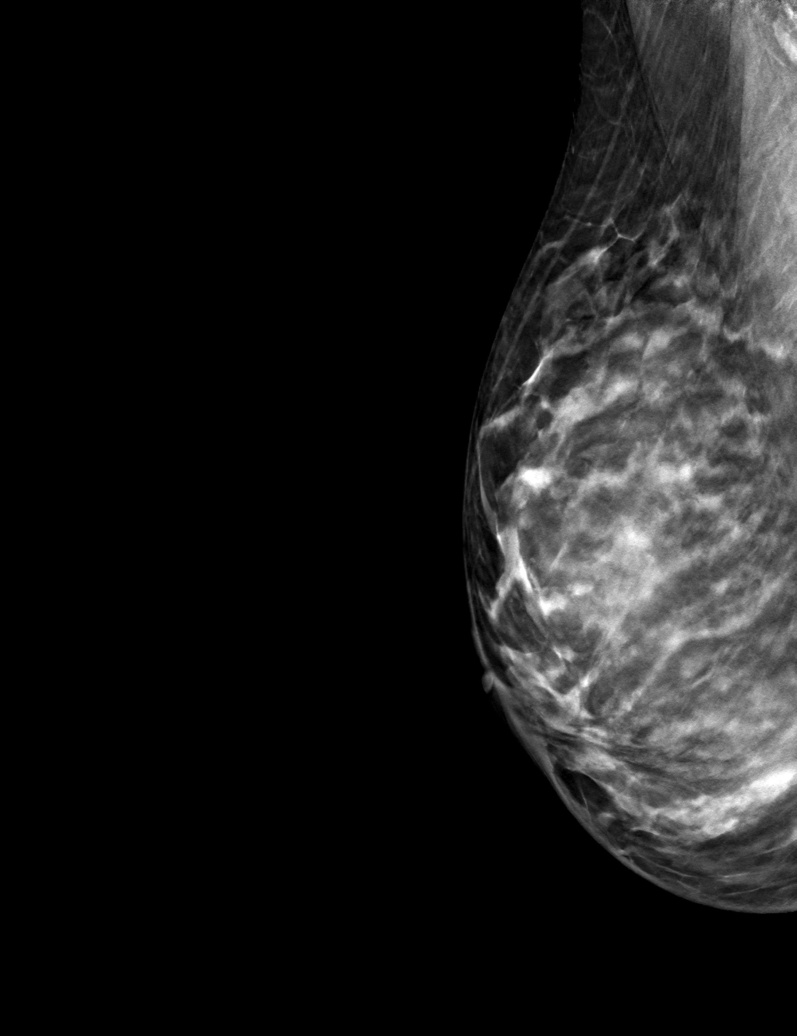
[frame 29/56]
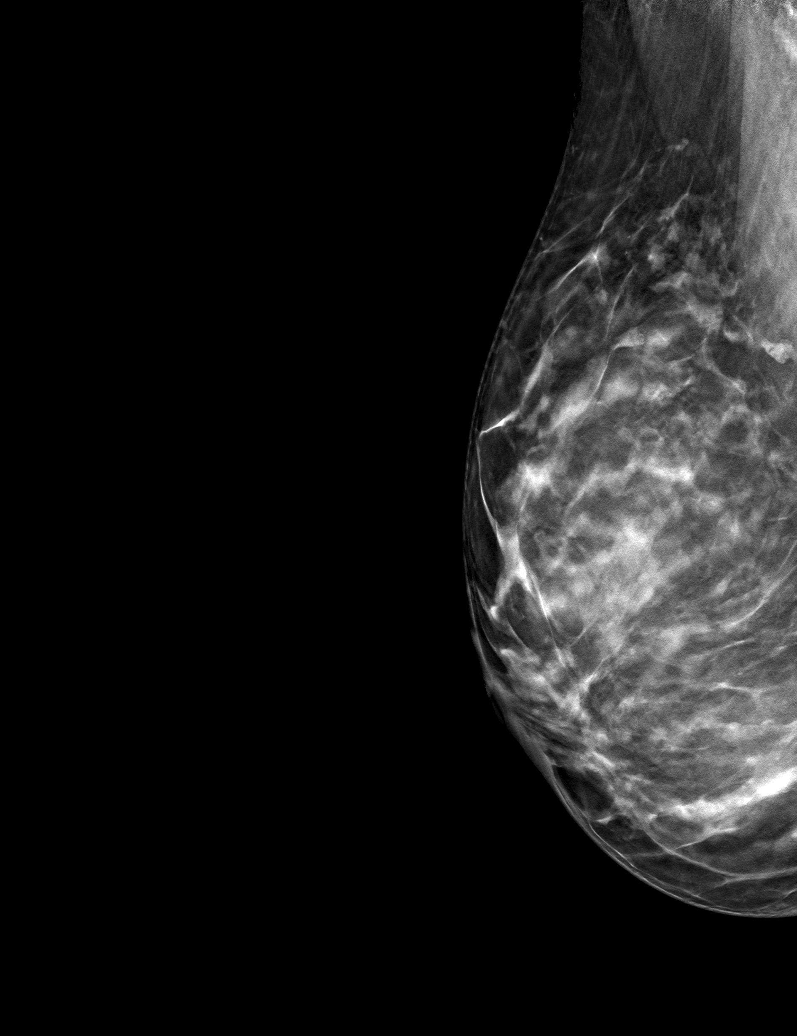

[6 of 9 positions shown; findings below may reference images not displayed]

ACR Breast Density Category c: The breast tissue is heterogeneously
dense, which may obscure small masses.
FINDINGS: Spot compression magnification images through the superior far
posterior right breast demonstrates a 1 cm group of amorphous
calcifications.
IMPRESSION: Indeterminate 1 cm group of calcifications in the superior posterior
right breast.

RECOMMENDATION:
1. Stereotactic biopsy is recommended for the right breast
calcifications. The procedure has been scheduled for [DATE] at
[DATE] a.m.

2. Consider genetics assessment to determine the patient's lifetime
risk of breast cancer given her family history, if this has not
already been performed. Per American Cancer Society guidelines, if
the patient has a calculated lifetime risk of developing breast
cancer of greater than 20%, annual screening MRI of the breasts
would be recommended at the time of screening mammography.

I have discussed the findings and recommendations with the patient.
If applicable, a reminder letter will be sent to the patient
regarding the next appointment.

BI-RADS CATEGORY  4: Suspicious.

## 2021-04-22 ENCOUNTER — Ambulatory Visit: Payer: 59 | Admitting: Family Medicine

## 2021-04-22 ENCOUNTER — Encounter: Payer: Self-pay | Admitting: Family Medicine

## 2021-04-22 ENCOUNTER — Other Ambulatory Visit: Payer: Self-pay

## 2021-04-22 VITALS — BP 131/87 | HR 75 | Temp 98.2°F | Ht 63.25 in | Wt 117.0 lb

## 2021-04-22 DIAGNOSIS — M25561 Pain in right knee: Secondary | ICD-10-CM

## 2021-04-22 DIAGNOSIS — M7651 Patellar tendinitis, right knee: Secondary | ICD-10-CM | POA: Diagnosis not present

## 2021-04-22 NOTE — Patient Instructions (Signed)
Patellar tendon strap- [employer]. Wear 4 weeks all waking hours, after which you will wear for activities only or recurrent flaring.   Voltaren over the counter gel is ok long term.  Advil ok for flares.    Patellar Tendinitis Patellar tendinitis is also called jumper's knee or patellar tendinopathy. This condition happens when there is damage to the patellar tendon. Tendons are cord-like tissues that connect muscles to bones. The patellar tendon connects the bottom of the kneecap (patella) to the top of the shin bone (tibia). Patellar tendinitis causes pain in the front of the knee. The condition is classified into the following stages: Stage 1: You have pain only after activity. Stage 2: You have pain during and after activity. Stage 3: You have pain at rest as well as during and after activity. The pain limits your ability to do the activity. Stage 4: You have tendon tears. The tears severely limit your activity. What are the causes? This condition is caused by repeated (repetitive) stress on the tendon. This stress may cause the tendon to stretch, swell, thicken, or tear. What increases the risk? The following factors may make you more likely to develop this condition: Participating in sports that involve running, kicking, and jumping, especially on hard surfaces. These include: Basketball. Volleyball. Soccer. Track and field. Training too hard. Having tight thigh muscles. Having received steroid injections in the tendon. Having had knee surgery. Being 99-67 years old. Having rheumatoid arthritis, diabetes, or kidney disease. These conditions interrupt blood flow to the knee, causing the tendon to weaken. What are the signs or symptoms? The main symptom of this condition is pain and swelling in the front of the knee. The pain usually starts slowly and gradually gets worse. It may become painful to straighten your leg. The pain may get worse when you walk, run, or jump. How is this  diagnosed? This condition may be diagnosed based on: Your symptoms. Your medical history. A physical exam. During the physical exam, your health care provider may check for: Tenderness along the tendon just below the patella. Tightness in your thigh muscles. Pain when you straighten your knee. Imaging tests, including: X-rays. These will show the position and condition of your patella. An MRI. This will show any abnormality of the tendon. Ultrasound. This will show any swelling or other abnormalities of the tendon. How is this treated? Treatment for this condition depends on the stage of the condition. It may involve: Avoiding activities that cause pain, such as jumping. Icing and elevating your knee. Having sound wave stimulation to promote healing. Doing physical therapy exercises to improve movement and strength in your knee when pain and swelling improve. Wearing a knee brace. This may be needed if your condition does not improve with treatment. Using crutches or a walker. This may be needed if your condition does not improve with treatment. Surgery. This may be done if you have stage 4 tendinitis. Follow these instructions at home: If you have a removable brace: Wear the brace as told by your health care provider. Remove it only as told by your health care provider. Check the skin around the brace every day. Tell your health care provider about any concerns. Loosen the brace if your toes tingle, become numb, or turn cold and blue. Keep the brace clean. If the brace is not waterproof: Do not let it get wet. Cover it with a watertight covering when you take a bath or shower. Ask your health care provider when it is safe  for you to drive. Managing pain, stiffness, and swelling  If directed, put ice on the injured area. To do this: If you have a removable brace, remove it as told by your health care provider. Put ice in a plastic bag. Place a towel between your skin and the  bag. Leave the ice on for 20 minutes, 2-3 times a day. Remove the ice if your skin turns bright red. This is very important. If you cannot feel pain, heat, or cold, you have a greater risk of damage to the area. Move your toes often to reduce stiffness and swelling. Raise (elevate) your knee above the level of your heart while you are sitting or lying down. Activity Do not use the injured limb to support your body weight until your health care provider says that you can. Use your crutches or a walker as told by your health care provider. Do exercises as told by your health care provider or physical therapist. Return to your normal activities as told by your health care provider. Ask your health care provider what activities are safe for you. General instructions Take over-the-counter and prescription medicines only as told by your health care provider. Do not use any products that contain nicotine or tobacco. These products include cigarettes, chewing tobacco, and vaping devices, such as e-cigarettes. These can delay healing. If you need help quitting, ask your health care provider. Keep all follow-up visits. This is important. How is this prevented? Warm up and stretch before being active. Cool down and stretch after being active. Give your body time to rest between periods of activity. You may need to reduce how often you play a sport that requires frequent jumping. Make sure to use equipment that fits you. Be safe and responsible while being active. This will help you avoid falls which can damage the tendon. Do at least 150 minutes of moderate-intensity exercise each week, such as brisk walking or water aerobics. Maintain physical fitness, including: Strength. Flexibility. Cardiovascular fitness. Endurance. Contact a health care provider if: Your symptoms have not improved in 6 weeks. Your symptoms get worse. Summary Patellar tendinitis is also called jumper's knee or patellar  tendinopathy. This condition happens when there is damage to the patellar tendon. Treatment for this condition depends on the stage of the condition and may include rest, ice, exercises, a knee brace, and surgery. Do not use the injured limb to support your body weight until your health care provider says that you can. Take over-the-counter and prescription medicines only as told by your health care provider. This information is not intended to replace advice given to you by your health care provider. Make sure you discuss any questions you have with your health care provider. Document Revised: 10/08/2020 Document Reviewed: 10/08/2020 Elsevier Patient Education  [place].

## 2021-04-22 NOTE — Progress Notes (Signed)
This visit occurred during the SARS-CoV-2 public health emergency.  Safety protocols were in place, including screening questions prior to the visit, additional usage of staff PPE, and extensive cleaning of exam room while observing appropriate contact time as indicated for disinfecting solutions.    April Sexton , [birth year], 56 y.o., female MRN: [id number] Patient Care Team    Relationship Specialty Notifications Start End  Ma Hillock, DO PCP - General Family Medicine  01/16/19     Chief Complaint  Patient presents with   Knee Pain    Pt c/o R knee pain x 2 mos worsen when bending     Subjective: Pt presents for an OV with complaints of right knee pain.  Patient states pain is in the front of her knee and knee and R side of her knee and has been present for 2-3 months.  The pain is worse with squatting, stairs and hiking downhill.  She denies any fevers, chills, swelling or erythema with pain onset.  She reports many years ago she had an injury to her knee.  She has not had any notable problems since.  Prior to onset of discomfort she had not changed her activity or noted any injury.  She has been hiking more frequently as of lately.  Depression screen 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    Arthritis    foot   Chicken pox    COVID 09/2020   History of UTI    Menorrhagia 2018   IUD placed for menorrhagia   Migraines    Past Surgical History:   Procedure Laterality Date   BREAST BIOPSY  1995   benign   BREAST EXCISIONAL BIOPSY Right 1995   HEMANGIOMA EXCISION  2008   Right Jaw    Family History  Problem Relation Age of Onset   Arthritis Mother    Breast cancer Mother 62       Recurrent at 5   Diabetes Mother    Hypertension Mother    Alcohol abuse Father    Early death Father    Hyperlipidemia Sister    Alcohol abuse Brother    Hypertension Brother    Colon polyps Brother    Asthma Paternal Grandfather    Breast cancer Maternal Aunt    Breast cancer Cousin        maternal   Colon cancer Neg Hx    Esophageal cancer Neg Hx    Stomach cancer Neg Hx    Rectal cancer Neg Hx    Allergies as of 04/22/2021       Reactions   Lactose Intolerance (gi) Diarrhea        Medication List        Accurate as of April 22, 2021 11:59 PM. If you have any questions, ask your nurse or doctor.  b complex vitamins capsule Take 1 capsule by mouth daily.   Calcium 200 MG Tabs Take 3 tablets by mouth daily.   co-enzyme Q-10 30 MG capsule Take 100 mg by mouth daily.   Iron 325 (65 Fe) MG Tabs Take 1 tablet by mouth daily.   levonorgestrel 20 MCG/24HR IUD Commonly known as: MIRENA 1 each by Intrauterine route once.   Magnesium 400 MG Caps Take 1 capsule by mouth daily.   Melatonin 2.5 MG Caps Take 2 capsules by mouth at bedtime.   MULTIVITAL PO Take by mouth.   SUMAtriptan 100 MG tablet Commonly known as: IMITREX Take 1 tablet (100 mg total) by mouth every 2 (two) hours as needed for migraine. May repeat in 2 hours, once,  if headache persists or recurs.   Vitamin D3 10 MCG (400 UNIT) Caps Take 2 capsules by mouth daily.        All past medical history, surgical history, allergies, family history, immunizations andmedications were updated in the EMR today and reviewed under the history and medication portions of their EMR.     ROS Negative, with the exception of above mentioned in  HPI   Objective:  BP 131/87    Pulse 75    Temp 98.2 F (36.8 C) (Oral)    Ht 5' 3.25" (1.607 m)    Wt 117 lb (53.1 kg)    SpO2 98%    BMI 20.56 kg/m  Body mass index is 20.56 kg/m.  Physical Exam Vitals and nursing note reviewed.  Constitutional:      General: She is not in acute distress.    Appearance: Normal appearance. She is normal weight. She is not ill-appearing or toxic-appearing.  Eyes:     Extraocular Movements: Extraocular movements intact.     Conjunctiva/sclera: Conjunctivae normal.     Pupils: Pupils are equal, round, and reactive to light.  Musculoskeletal:     Right knee: Bony tenderness and crepitus present. No swelling, deformity, effusion, erythema, ecchymosis or lacerations. Decreased range of motion. Tenderness present over the medial joint line and patellar tendon.     Instability Tests: Anterior drawer test negative. Posterior drawer test negative. Anterior Lachman test negative.     Left knee: Normal.  Neurological:     Mental Status: She is alert and oriented to person, place, and time. Mental status is at baseline.  Psychiatric:        Mood and Affect: Mood normal.        Behavior: Behavior normal.        Thought Content: Thought content normal.        Judgment: Judgment normal.      No results found. No results found. No results found for this or any previous visit (from the past 24 hour(s)).  Assessment/Plan: April Sexton is a 56 y.o. female present for OV for  Patellar tendinitis of right knee/Acute pain of right knee Rest knee.  Ice as needed.  She is tender over tibial tuberosity and patellar tendon on exam today.  Suspect jumper's knee from her hiking.  She also has mild tenderness over the medial meniscus.  This does not seem to be her main issue today by exam.   Wear patellar tendon brace/strap for 4 weeks during all waking hours, except showering. If symptoms are improved after 4 weeks, can transition into wearing support  with activity only. Follow-up 4 weeks as needed  Reviewed expectations re: course of current medical issues. Discussed self-management of symptoms.  Outlined signs and symptoms indicating need for more acute intervention. Patient verbalized understanding and all questions were answered. Patient received an After-Visit Summary.    No orders of the defined types were placed in this encounter.  No orders of the defined types were placed in this encounter.  Referral Orders  No referral(s) requested today     Note is dictated utilizing voice recognition software. Although note has been proof read prior to signing, occasional typographical errors still can be missed. If any questions arise, please do not hesitate to call for verification.   electronically signed by:  Howard Pouch, DO  [place]

## 2021-04-23 DIAGNOSIS — M7651 Patellar tendinitis, right knee: Secondary | ICD-10-CM | POA: Insufficient documentation

## 2021-04-29 ENCOUNTER — Ambulatory Visit
Admission: RE | Admit: 2021-04-29 | Discharge: 2021-04-29 | Disposition: A | Discharge status: Home / Self Care | Payer: 59 | Source: Ambulatory Visit | Attending: Family Medicine | Admitting: Family Medicine

## 2021-04-29 ENCOUNTER — Other Ambulatory Visit: Payer: Self-pay

## 2021-04-29 DIAGNOSIS — R928 Other abnormal and inconclusive findings on diagnostic imaging of breast: Secondary | ICD-10-CM

## 2021-04-29 HISTORY — PX: BREAST BIOPSY: SHX20

## 2021-04-29 IMAGING — MG MM BREAST BX W/ LOC DEV 1ST LESION IMAGE BX SPEC STEREO GUIDE*R*
8 of 12 series · 8 of 24 positions shown · non-contrast
Comparison: Previous exams.
COMPARISON: Previous exams.

Addendum:
CLINICAL DATA: Patient presents for stereotactic guided core biopsy
of the RIGHT breast.

EXAM:
RIGHT BREAST STEREOTACTIC CORE NEEDLE BIOPSY

[R (1 of 7)]
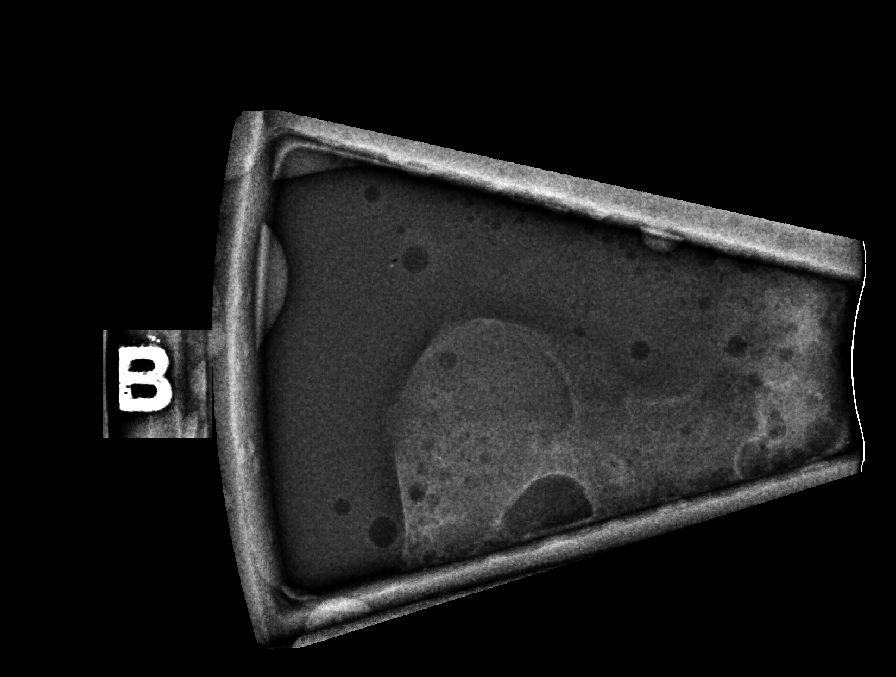

[R (2 of 7)]
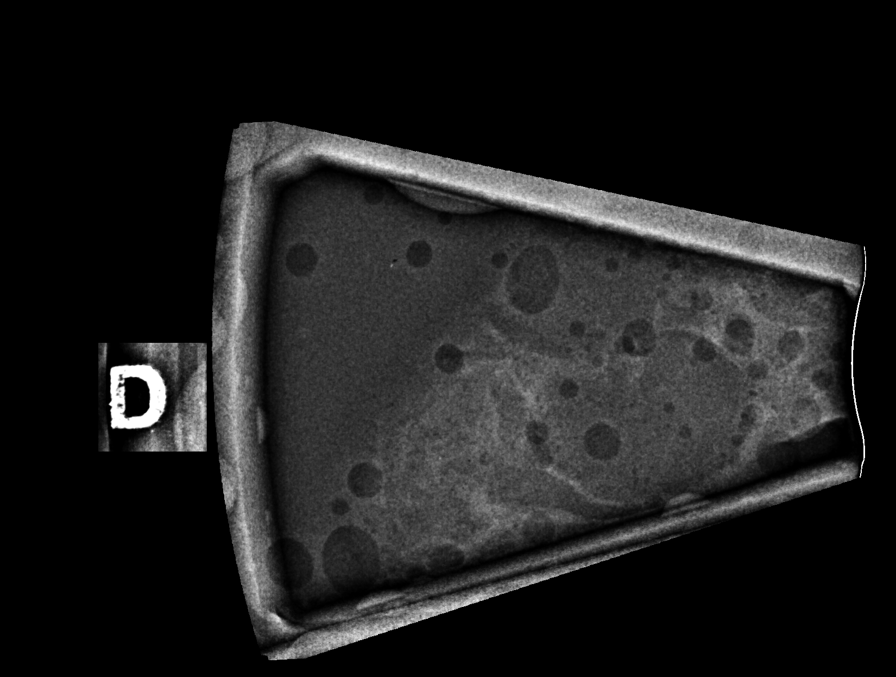

[R (3 of 7)]
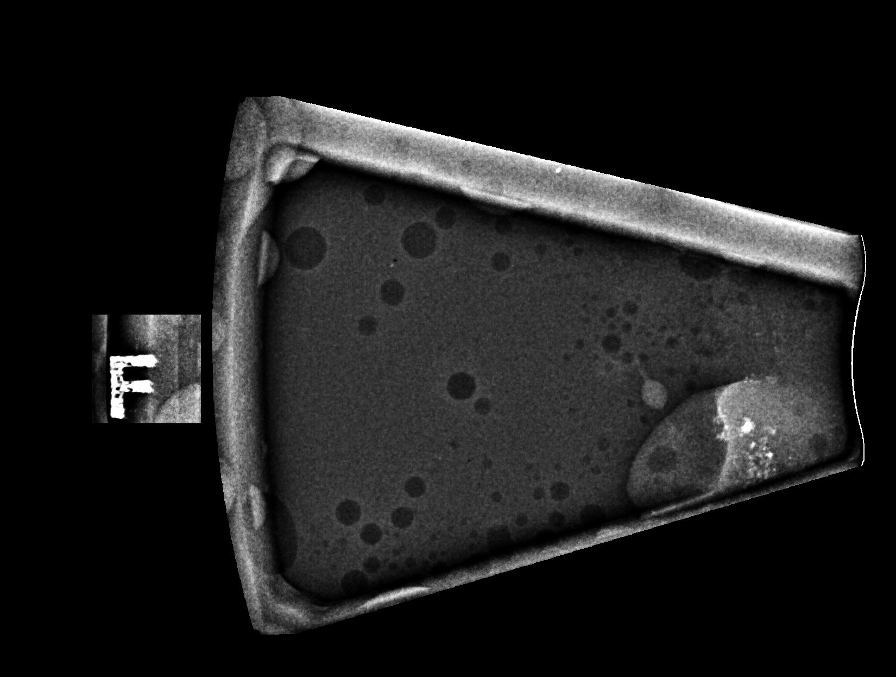

[R (4 of 7)]
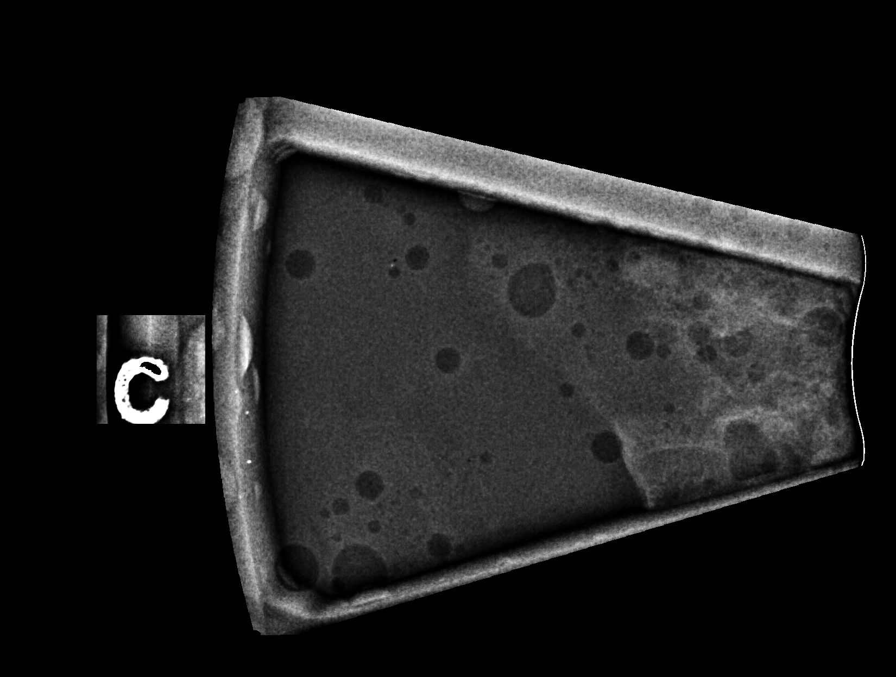

[R (5 of 7)]
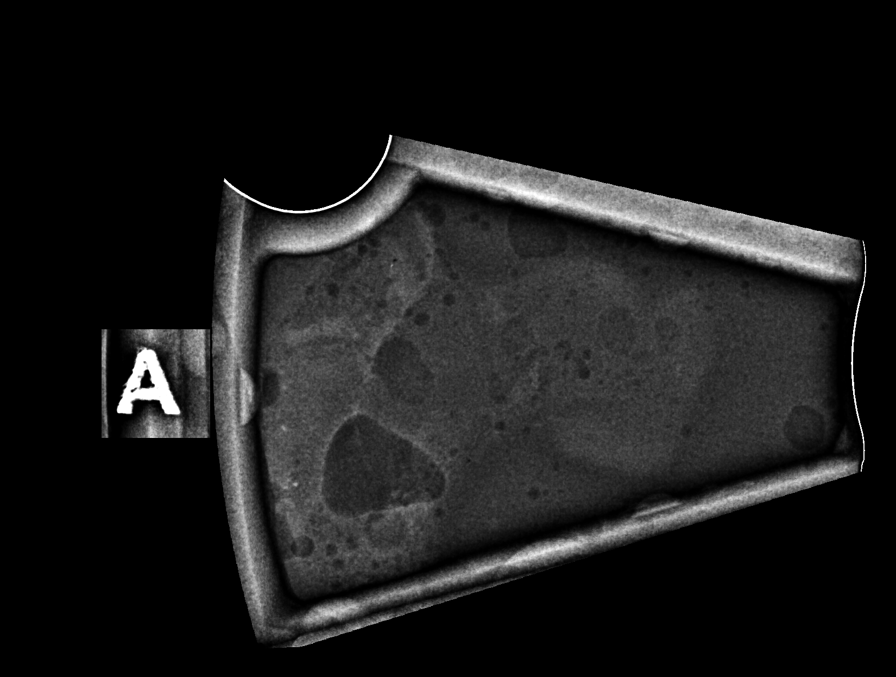

[R (6 of 7)]
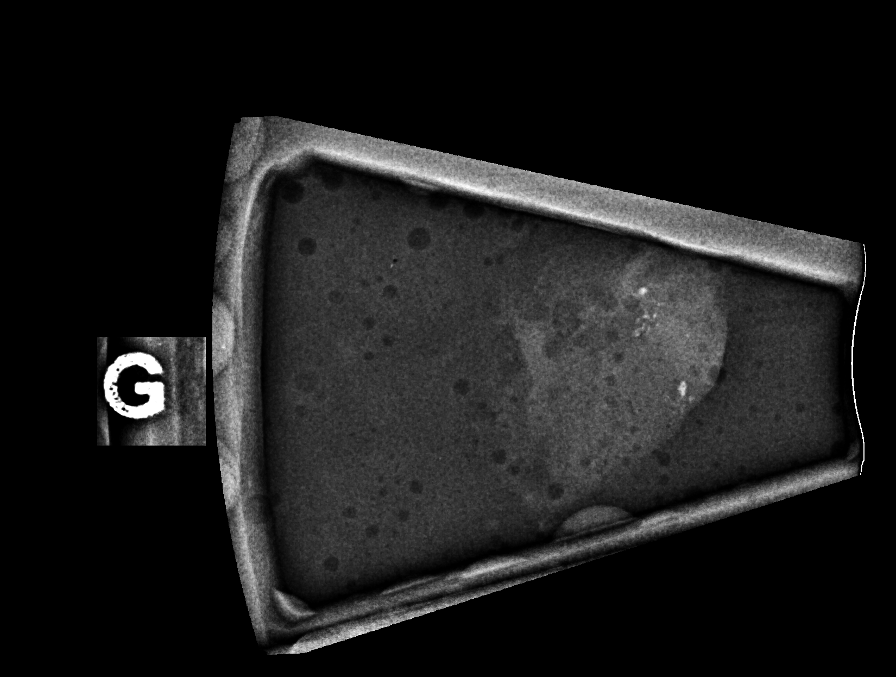

[R (7 of 7)]
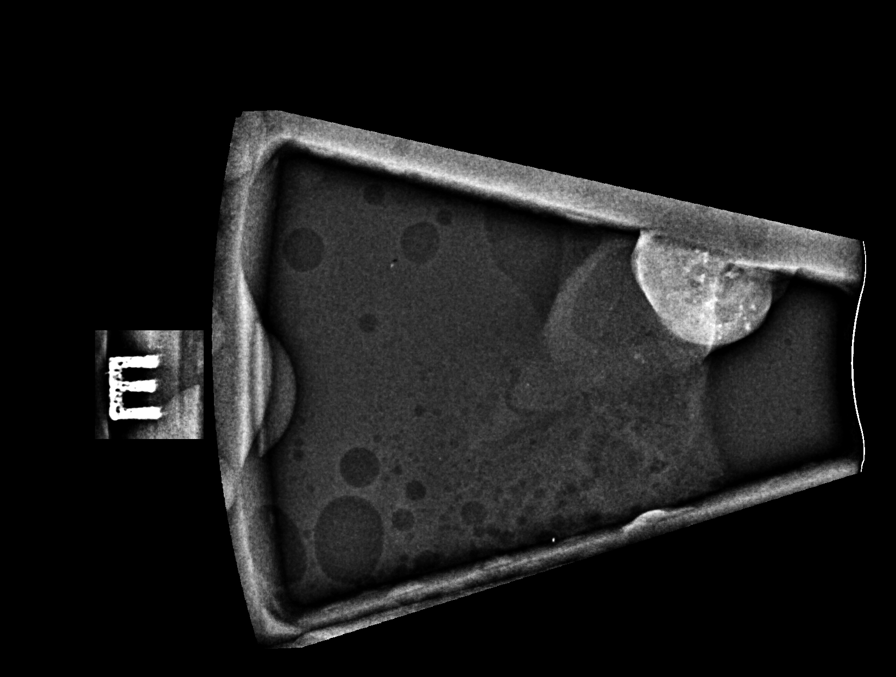

[R CC]
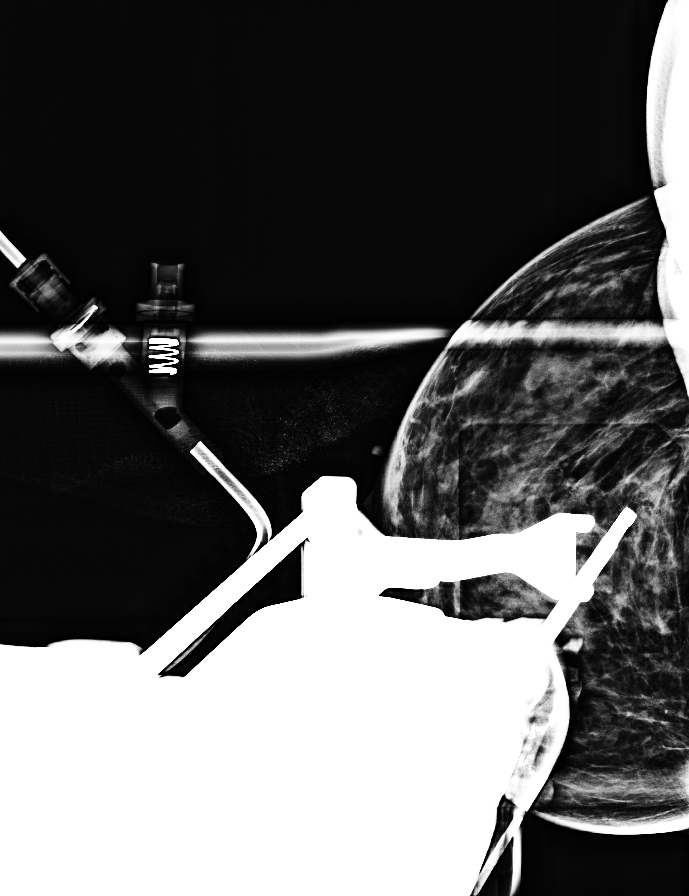

[8 of 24 positions shown; findings below may reference images not displayed]



Using sterile technique and 1% lidocaine and 1% lidocaine with
epinephrine as local anesthetic, under stereotactic guidance, a 9
gauge vacuum assisted device was used to perform core needle biopsy
of calcifications in the posterior UPPER central RIGHT breast using
a craniocaudal approach. Specimen radiograph was performed showing
calcifications in tissue samples. Specimens with calcifications are
identified for pathology.

Lesion quadrant: Posterior UPPER central RIGHT breast

At the conclusion of the procedure, coil shaped tissue marker clip
was deployed into the biopsy cavity. Follow-up 2-view mammogram was
performed and dictated separately.
IMPRESSION: Stereotactic-guided biopsy of RIGHT breast calcifications. No
apparent complications.

ADDENDUM:
Pathology revealed LOBULAR NEOPLASIA (ATYPICAL LOBULAR HYPERPLASIA),
ADENOSIS WITH CALCIFICATIONS of the RIGHT breast, upper posterior,
(coil clip). This was found to be concordant by Dr. GIRARD with
surgical consultation to discuss options.

Pathology results were discussed with the patient by telephone. The
patient reported doing well after the biopsy with tenderness at the
site. Post biopsy instructions and care were reviewed and questions
were answered. The patient was encouraged to call The [REDACTED] for any additional concerns. My direct number
was provided.

Follow up protocol for patients with lobular neoplasia being
observed will have diagnostic mammograms for two years (6 month
follow up, 6 month follow up, 12 month follow up), then returned to
annual screening mammograms.

Surgical consultation has been arranged with Dr. GIRARD at
[REDACTED] on [DATE].

Pathology results reported by GIRARD, RN on [DATE].



Using sterile technique and 1% lidocaine and 1% lidocaine with
epinephrine as local anesthetic, under stereotactic guidance, a 9
gauge vacuum assisted device was used to perform core needle biopsy
of calcifications in the posterior UPPER central RIGHT breast using
a craniocaudal approach. Specimen radiograph was performed showing
calcifications in tissue samples. Specimens with calcifications are
identified for pathology.

Lesion quadrant: Posterior UPPER central RIGHT breast

At the conclusion of the procedure, coil shaped tissue marker clip
was deployed into the biopsy cavity. Follow-up 2-view mammogram was
performed and dictated separately.
IMPRESSION: Stereotactic-guided biopsy of RIGHT breast calcifications. No
apparent complications.

## 2021-04-29 IMAGING — MG MM BREAST LOCALIZATION CLIP
4 series · 4 of 12 positions shown · non-contrast
Comparison: Previous exam(s).

CLINICAL DATA: Status post stereotactic guided core biopsy of RIGHT
breast calcifications.

EXAM:
3D DIAGNOSTIC RIGHT MAMMOGRAM POST STEREOTACTIC BIOPSY

[R CC synth-2D]
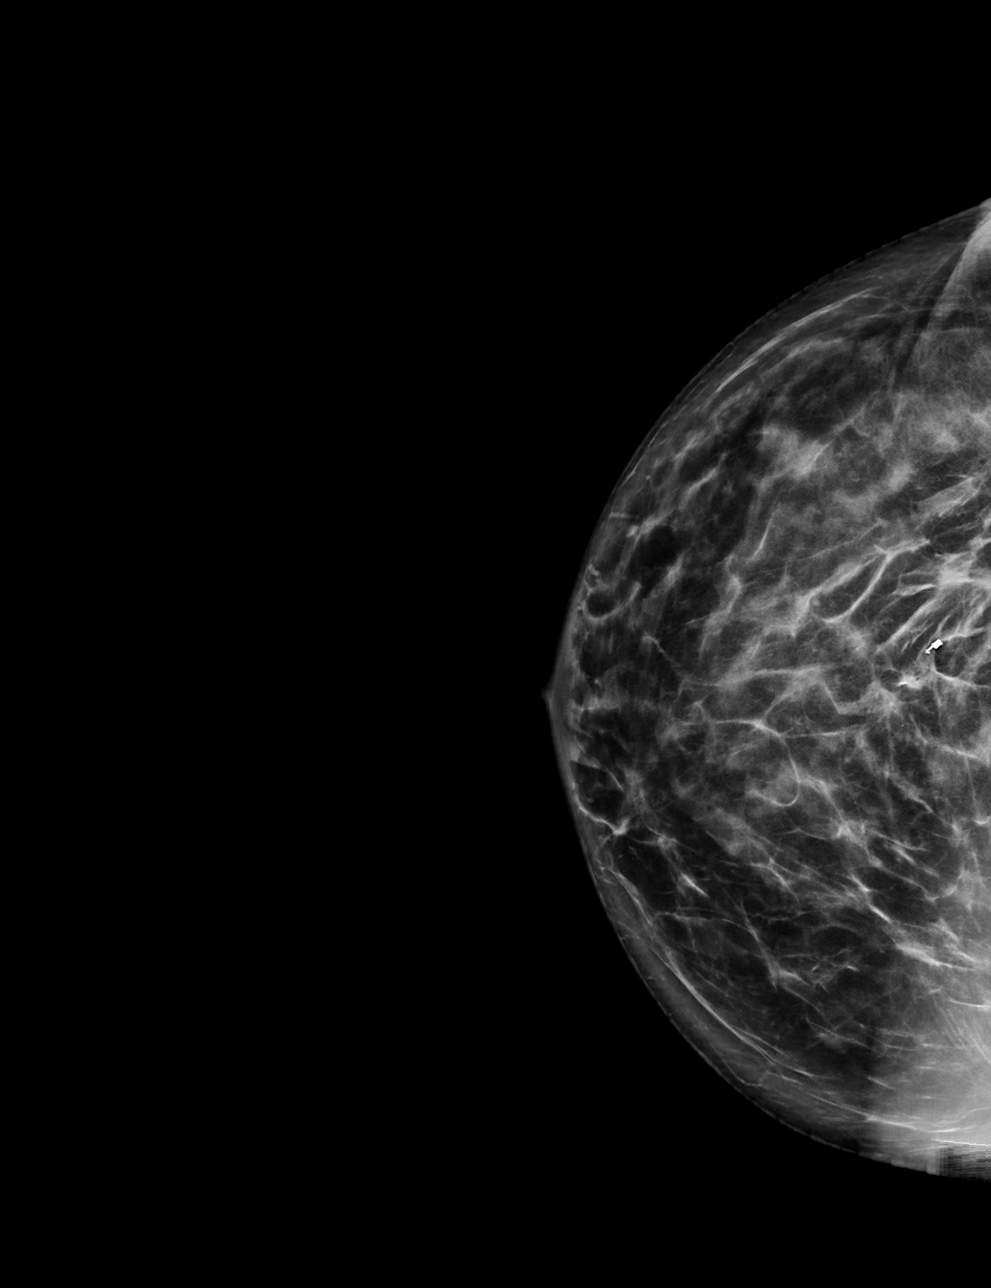

[R ML synth-2D]
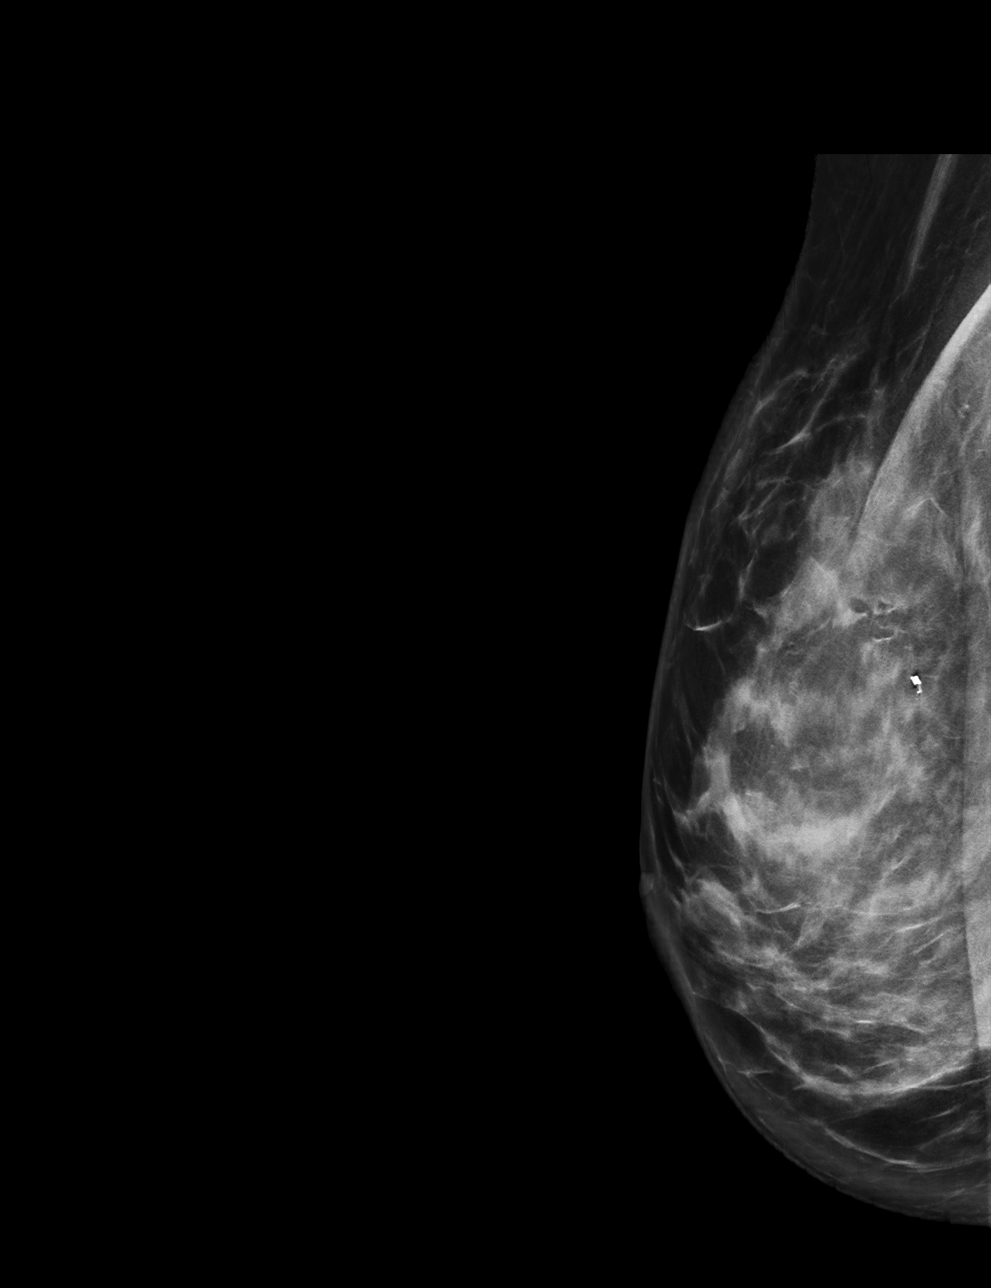

[R CC tomo · tomo slice 33/64.0]
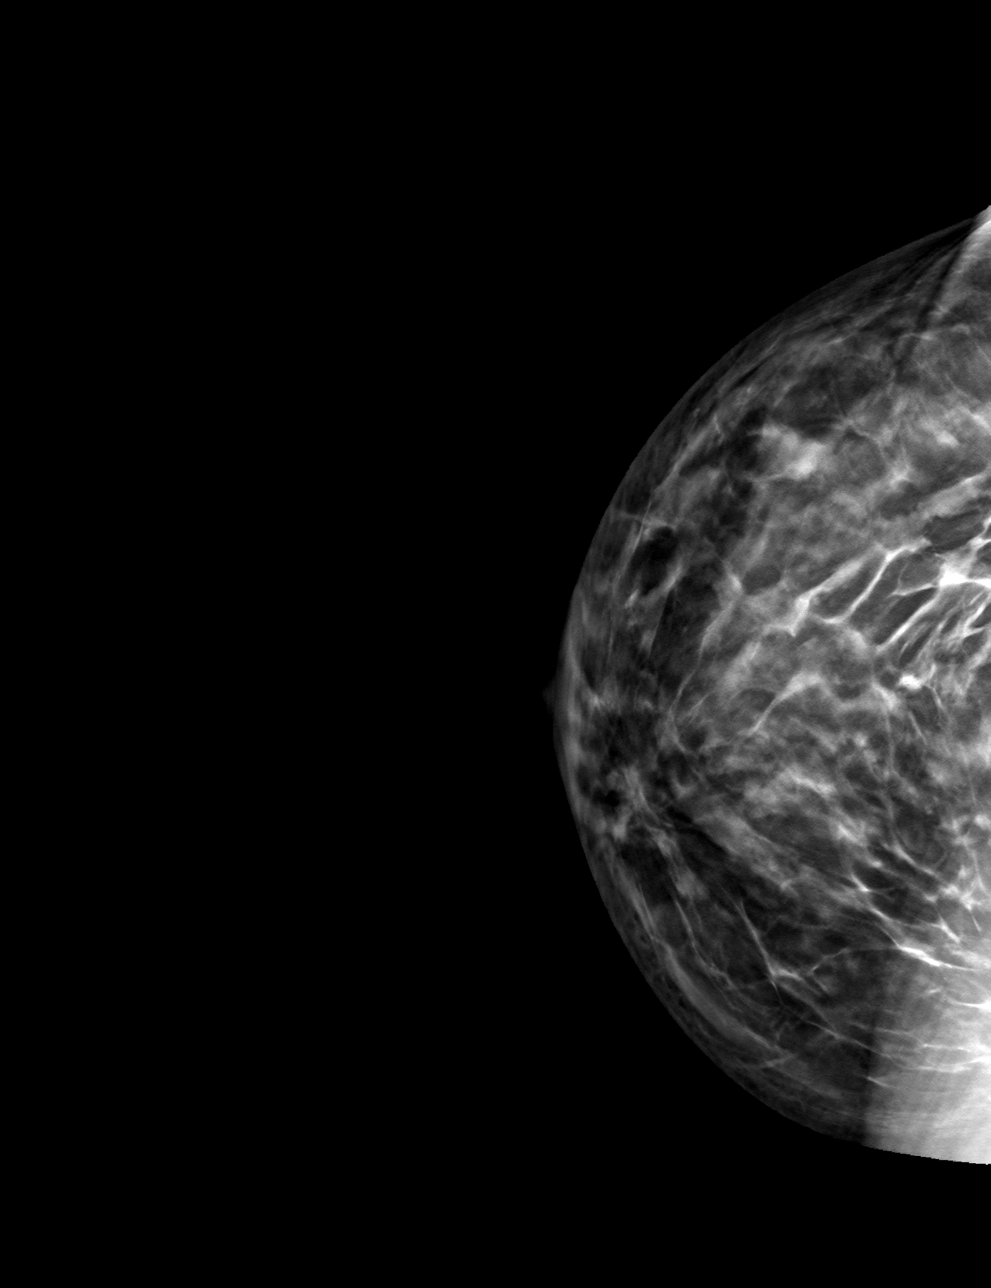

[R ML tomo · tomo slice 38/75.0]
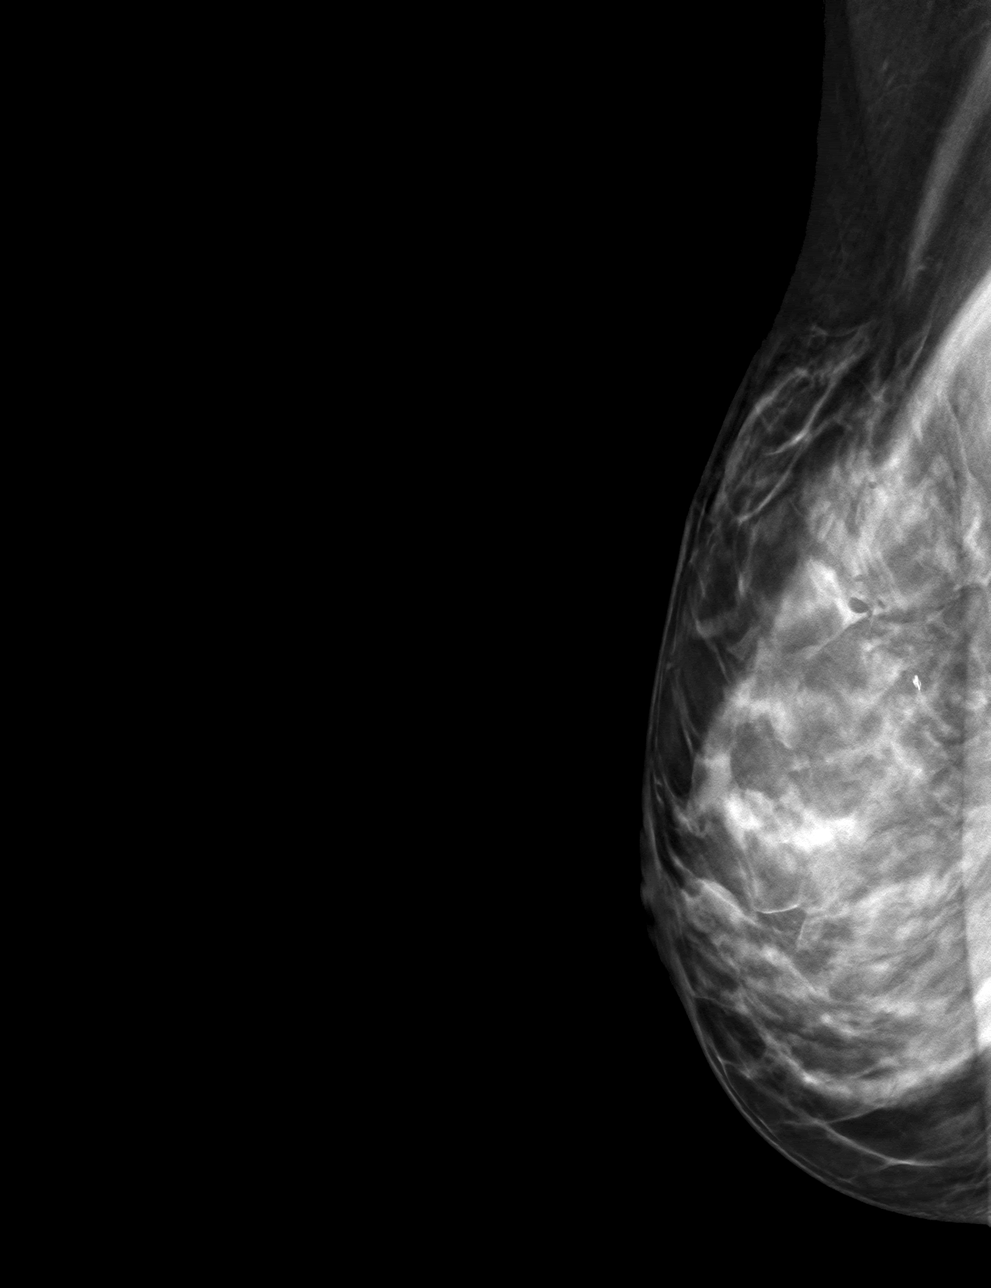

[4 of 12 positions shown; findings below may reference images not displayed]

FINDINGS: 3D Mammographic images were obtained following stereotactic guided
biopsy of RIGHT breast calcifications and placement of a coil shaped
clip. The biopsy marking clip is in expected position at the site of
biopsy.
IMPRESSION: Appropriate positioning of the coil shaped biopsy marking clip at
the site of biopsy in the posterior UPPER central RIGHT breast.

Final Assessment: Post Procedure Mammograms for Marker Placement

## 2021-05-09 ENCOUNTER — Ambulatory Visit: Payer: 59 | Admitting: Obstetrics & Gynecology

## 2021-05-19 ENCOUNTER — Other Ambulatory Visit: Payer: Self-pay

## 2021-05-19 ENCOUNTER — Encounter: Payer: Self-pay | Admitting: Obstetrics & Gynecology

## 2021-05-19 ENCOUNTER — Other Ambulatory Visit (HOSPITAL_COMMUNITY)
Admission: RE | Admit: 2021-05-19 | Discharge: 2021-05-19 | Disposition: A | Discharge status: Home / Self Care | Payer: 59 | Source: Ambulatory Visit | Attending: Obstetrics & Gynecology | Admitting: Obstetrics & Gynecology

## 2021-05-19 ENCOUNTER — Ambulatory Visit: Payer: 59 | Admitting: Obstetrics & Gynecology

## 2021-05-19 DIAGNOSIS — R87612 Low grade squamous intraepithelial lesion on cytologic smear of cervix (LGSIL): Secondary | ICD-10-CM | POA: Diagnosis not present

## 2021-05-19 NOTE — Progress Notes (Signed)
? ? ?  April Sexton 1965/04/09 [id number] ? ? ?     56 y.o.  G1P1L1 Married.  From [place], moved 3 yrs ago.  Daughter is 63 yo. ? ?RP: LGSIL for Colposcopy  ? ?HPI: Previous Paps all normal.  HPV HR Neg in 12/2019.  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 ? ? ?OB History  ?Gravida Para Term Preterm AB Living  ?'1 1       1  '$ ?SAB IAB Ectopic Multiple Live Births  ?           ?  ?# Outcome Date GA Lbr Len/2nd Weight Sex Delivery Anes PTL Lv  ?1 Para           ? ? ?Past medical history,surgical history, problem list, medications, allergies, family history and social history were all reviewed and documented in the EPIC chart. ? ? ?Directed ROS with pertinent positives and negatives documented in the history of present illness/assessment and plan. ? ?Exam: ? ?Vitals:  ? 05/19/21 1030  ?BP: 136/82  ?Pulse: 78  ?Resp: 12  ?Weight: 121 lb (54.9 kg)  ?Height: '5\' 4"'$  (1.626 m)  ? ?General appearance:  Normal ? ?Colposcopy Procedure Note ?April Sexton ?05/19/2021 ? ?Indications: LGSIL ? ?Procedure Details  ?The risks and benefits of the procedure and Written informed consent obtained. ? ?Speculum placed in vagina and excellent visualization of cervix achieved, cervix swabbed x 3 with acetic acid solution. ? ?Findings: ? ?Cervix colposcopy: Physical Exam ?Genitourinary: ? ? ? ? ? ?Vaginal colposcopy: Normal ? ?Vulvar colposcopy: Normal ? ?Perirectal colposcopy: Normal ? ?The cervix was sprayed with Hurricane before performing the cervical biopsies. ? ?Specimens: HPV HR.  Cervical Bx at 12 and 6 O'Clock. ? ?Complications: No Cx, Silver Nitrate for hemostasis. ?. ?Plan: Management per HPV HR and Cervical Bx results ? ? ?Assessment/Plan:  56 y.o. G1P1  ? ?1. LGSIL on Pap smear of cervix ?LGSIL significance discussed.  HPV HR done.  Colposcopy procedure explained.  Colpo findings reviewed.  Cervical Bxs x 2 done. Post procedure precautions.  Management per results. ?- Colposcopy ?- Surgical pathology( [facility]) ?- Cervicovaginal ancillary only( )  ? ?Princess Bruins MD, 11:02 AM 05/19/2021 ? ? ? ?  ?

## 2021-05-20 ENCOUNTER — Encounter: Payer: Self-pay | Admitting: Obstetrics & Gynecology

## 2021-05-20 LAB — SURGICAL PATHOLOGY

## 2021-05-26 LAB — CERVICOVAGINAL ANCILLARY ONLY
Comment: NEGATIVE
High risk HPV: NEGATIVE

## 2021-05-27 ENCOUNTER — Telehealth: Payer: Self-pay

## 2021-05-27 NOTE — Telephone Encounter (Signed)
While notifying pt of colpo results, she had a question re: HPV vaccine. Desires to know your opinion as far as the value of it for people of older ages. Is this something she should consider at this point? Please advise.  ?

## 2021-05-28 NOTE — Telephone Encounter (Signed)
Per Dr. Dellis Filbert "Not recommended at this point.  Could give it if she had Severe Dysplasia."  ? ?Left detailed VM on mobile number per DPR.  ?

## 2021-05-29 ENCOUNTER — Other Ambulatory Visit: Payer: Self-pay | Admitting: Surgery

## 2021-06-02 ENCOUNTER — Telehealth: Payer: Self-pay | Admitting: Genetic Counselor

## 2021-06-02 NOTE — Telephone Encounter (Signed)
Scheduled appt per 3/16 referral. Pt is aware of appt date and time. Pt is aware to arrive 15 mins prior to appt time and to bring and updated insurance card. Pt is aware of appt location.   ?

## 2021-06-05 ENCOUNTER — Other Ambulatory Visit: Payer: Self-pay

## 2021-06-05 ENCOUNTER — Encounter: Payer: Self-pay | Admitting: Genetic Counselor

## 2021-06-05 ENCOUNTER — Inpatient Hospital Stay: Payer: 59 | Attending: Genetic Counselor | Admitting: Genetic Counselor

## 2021-06-05 ENCOUNTER — Inpatient Hospital Stay: Payer: 59

## 2021-06-05 DIAGNOSIS — Z803 Family history of malignant neoplasm of breast: Secondary | ICD-10-CM

## 2021-06-05 DIAGNOSIS — Z8051 Family history of malignant neoplasm of kidney: Secondary | ICD-10-CM

## 2021-06-05 DIAGNOSIS — Z800 Family history of malignant neoplasm of digestive organs: Secondary | ICD-10-CM

## 2021-06-05 HISTORY — DX: Family history of malignant neoplasm of digestive organs: Z80.0

## 2021-06-05 HISTORY — DX: Family history of malignant neoplasm of kidney: Z80.51

## 2021-06-05 LAB — GENETIC SCREENING ORDER

## 2021-06-05 NOTE — Progress Notes (Signed)
REFERRING PROVIDER: ?Erroll Luna, MD ?[facility] ?[place],  [place] [address] ? ?PRIMARY PROVIDER:  ?Howard Pouch A, DO ? ?PRIMARY REASON FOR VISIT:  ?1. Family history of breast cancer in first degree relative   ?2. Family history of colon cancer   ?3. Family history of kidney cancer   ? ? ? ?HISTORY OF PRESENT ILLNESS:   ?Ms. April Sexton, a 56 y.o. female, was seen for a [facility] cancer genetics consultation at the request of Dr. Brantley Stage due to a family history of cancer.  Ms. April Sexton presents to clinic today to discuss the possibility of a hereditary predisposition to cancer, to discuss genetic testing, and to further clarify her future cancer risks, as well as potential cancer risks for family members.  ? ?Ms. April Sexton is a 56 y.o. female with no personal history of cancer.   ? ?CANCER HISTORY:  ?Oncology History  ? No history exists.  ? ? ?RISK FACTORS:  ?Mammogram within the last year: yes ?Number of breast biopsies: R atypical lobular hyperplasia in 2023; lumpectomy in 1995 (benign per patient) ?Colonoscopy: yes;  four tubular adenomas; plans to f/u in 3 year intervals . ?Hysterectomy: no.  ?Ovaries intact: yes.  ?Up to date with pelvic exams: yes. ?Menarche was at age 56.  ?First live birth at age 80.  ?OCP use for approximately  17  years.  ?HRT use: 0 years. ? ? ?Past Medical History:  ?Diagnosis Date  ? Allergy   ? hayfever/birch tree allergy.   ? 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 Chicken pox   ? COVID 09/2020  ? Family history of colon cancer 06/05/2021  ? Family history of kidney cancer 06/05/2021  ? History of UTI   ? Menorrhagia 2018  ? IUD placed for menorrhagia  ? Migraines   ? ? ?Past Surgical History:  ?Procedure Laterality Date  ? BREAST BIOPSY  1995  ? benign  ? BREAST EXCISIONAL BIOPSY Right 1995  ? HEMANGIOMA EXCISION  2008  ? Right Jaw   ? ? ?  ?FAMILY HISTORY:  ?We obtained a detailed, 4-generation family history.  Significant diagnoses are listed  below: ?Family History  ?Problem Relation Age of Onset  ? Breast cancer Mother 42  ?     again at 35  ? Colon cancer Mother 70  ? Colon polyps Mother   ?     less than 10 lifetime  ? Breast cancer Maternal Aunt 58  ?     again at 11  ? Kidney cancer Maternal Aunt 11  ? Breast cancer Cousin 62  ?     maternal female cousin  ? ? ?Ms. April Sexton reported that her maternal cousin may have had positive genetic testing, which she stated was de novo.  No report available for review today. Ms. April Sexton is unaware of other previous family history of genetic testing for hereditary cancer risks. Patient's maternal ancestors are of [place] descent, and paternal ancestors are of [place] descent. There is no reported Ashkenazi Jewish ancestry. There is no known consanguinity. ? ?GENETIC COUNSELING ASSESSMENT: Ms. April Sexton is a 56 y.o. female with a family history of cancer which is somewhat suggestive of a hereditary cancer syndrome and predisposition to cancer given the presence of related cancers in the family. We, therefore, discussed and recommended the following at today's visit.  ? ?DISCUSSION: We discussed that 5 - 10% of cancer is hereditary, with most cases of hereditary breast cancer  associated with mutations in BRCA1/2.  There are other genes that can be associated with hereditary breast cancer syndromes.  We discussed that testing is beneficial for several reasons, including knowing about other cancer risks, identifying potential screening and risk-reduction options that may be appropriate, and to understanding if other family members could be at risk for cancer and allowing them to undergo genetic testing. ? ?We reviewed the characteristics, features and inheritance patterns of hereditary cancer syndromes. We also discussed genetic testing, including the appropriate family members to test, the process of testing, insurance coverage and turn-around-time for results. We discussed the  implications of a negative, positive, carrier and/or variant of uncertain significant result. We discussed that negative results would be uninformative given that Ms. April Sexton does not have a personal history of cancer. We recommended Ms. April Sexton pursue genetic testing for a panel that contains genes associated with breast, colon, and kidney cancers. ? ?The CancerNext-Expanded gene panel offered by [facility] and includes sequencing, rearrangement, and RNA analysis for the following 77 genes: AIP, ALK, APC, ATM, AXIN2, BAP1, BARD1, BLM, BMPR1A, BRCA1, BRCA2, BRIP1, CDC73, CDH1, CDK4, CDKN1B, CDKN2A, CHEK2, CTNNA1, DICER1, FANCC, FH, FLCN, GALNT12, KIF1B, LZTR1, MAX, MEN1, MET, MLH1, MSH2, MSH3, MSH6, MUTYH, NBN, NF1, NF2, NTHL1, PALB2, PHOX2B, PMS2, POT1, PRKAR1A, PTCH1, PTEN, RAD51C, RAD51D, RB1, RECQL, RET, SDHA, SDHAF2, SDHB, SDHC, SDHD, SMAD4, SMARCA4, SMARCB1, SMARCE1, STK11, SUFU, TMEM127, TP53, TSC1, TSC2, VHL and XRCC2 (sequencing and deletion/duplication); EGFR, EGLN1, HOXB13, KIT, MITF, PDGFRA, POLD1, and POLE (sequencing only); EPCAM and GREM1 (deletion/duplication only).  ? ?Based on Ms. Truss's family history of breast cancer, she meets medical criteria for genetic testing. Despite that she meets criteria, she may still have an out of pocket cost. We discussed that if her out of pocket cost for testing is over $100, the laboratory should contact her to discuss self-pay options and/or patient pay assistance programs.  ? ?We discussed that some people do not want to undergo genetic testing due to fear of genetic discrimination.  A federal law called the Genetic Information Non-Discrimination Act (GINA) of 2008 helps protect individuals against genetic discrimination based on their genetic test results.  It impacts both health insurance and employment.  With health insurance, it protects against increased premiums, being kicked off insurance or being forced to take a  test in order to be insured.  For employment it protects against hiring, firing and promoting decisions based on genetic test results.  GINA does not apply to those in the [employer], those who work for companies with less than 15 employees, and new life insurance or long-term disability insurance policies.  Health status due to a cancer diagnosis is not protected under GINA. ? ?PLAN: After considering the risks, benefits, and limitations, Ms. April Sexton provided informed consent to pursue genetic testing and the blood sample was sent to [facility] for analysis of the CancerNext-Expanded +RNAinsight Panel. Results should be available within approximately 3 weeks' time, at which point they will be disclosed by telephone to Ms. April Sexton, as will any additional recommendations warranted by these results. Ms. April Sexton will receive a summary of her genetic counseling visit and a copy of her results once available. This information will also be available in Epic.  ? ?Lastly, we encouraged Ms. April Sexton to remain in contact with cancer genetics annually so that we can continuously update the family history and inform her of any changes in cancer genetics and testing that may be of benefit for  this family.  ? ?Ms. April Sexton's questions were answered to her satisfaction today. Our contact information was provided should additional questions or concerns arise. Thank you for the referral and allowing [place] to share in the care of your patient.  ? ?April Sexton M. Joette Catching, [place], [place] ?Genetic Counselor ?[email or web address] ?(P) [phone]  ? ?The patient was seen for a total of 30 minutes in face-to-face genetic counseling.  The patient was seen alone.  Drs. Lindi Adie and/or Burr Medico were available to discuss this case as needed.  ?_______________________________________________________________________ ?For Office Staff:  ?Number of people involved in session: 1 ?Was an Intern/ student  involved with case: no ? ? ?

## 2021-06-16 ENCOUNTER — Ambulatory Visit
Admission: RE | Admit: 2021-06-16 | Discharge: 2021-06-16 | Disposition: A | Discharge status: Home / Self Care | Payer: 59 | Source: Ambulatory Visit | Attending: Surgery | Admitting: Surgery

## 2021-06-16 DIAGNOSIS — N6099 Unspecified benign mammary dysplasia of unspecified breast: Secondary | ICD-10-CM

## 2021-06-16 IMAGING — MR MR BREAST BILAT WO/W CM
8 of 13 series · 32 of 48 positions shown · IV contrast (gadavist)
Comparison: Previous exam(s).

CLINICAL DATA: Recent biopsy of the RIGHT breast shows lobular
neoplasia (atypical lobular hyperplasia), adenosis with
calcifications. Biopsy was marked with a coil shaped clip.

EXAM:
BILATERAL BREAST MRI WITH AND WITHOUT CONTRAST
TECHNIQUE: Multiplanar, multisequence MR images of both breasts were obtained
prior to and following the intravenous administration of 6 ml of
Gadavist

[Series 2: t2_tirm_tra ipat (a-p) · axial · 3.0mm · 0.70mm/px · 1 of 54 slices shown]
[im 1/54]
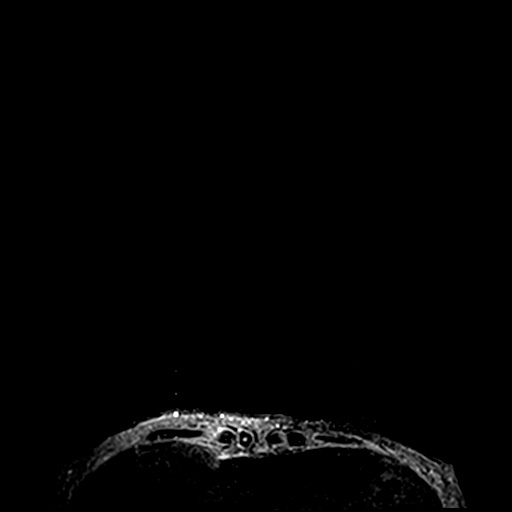

[Series 3: fl3d pre-cm no · axial · non-contrast · 1.2mm · 0.94mm/px · z∈[-111,+60]mm · 5 of 144 slices shown]
[im 1/144]
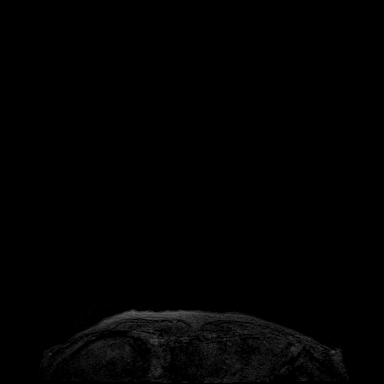
[im 36/144]
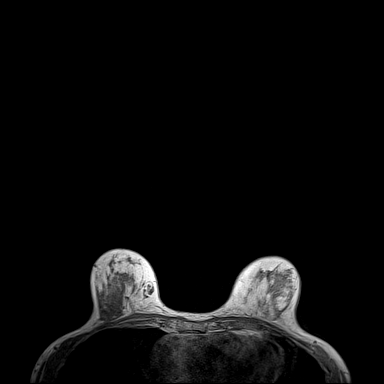
[im 72/144]
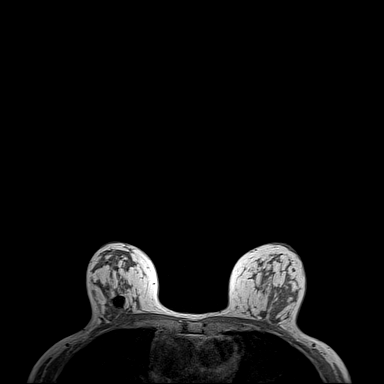
[im 108/144]
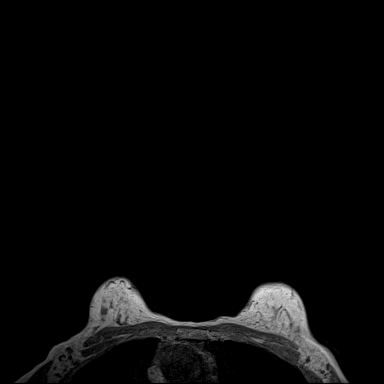
[im 144/144]
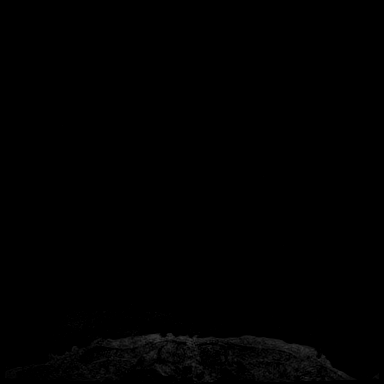

[Series 4: fl3d pre-cm · axial · non-contrast · 1.2mm · 0.94mm/px · z∈[-111,+60]mm · 5 of 144 slices shown]
[im 1/144]
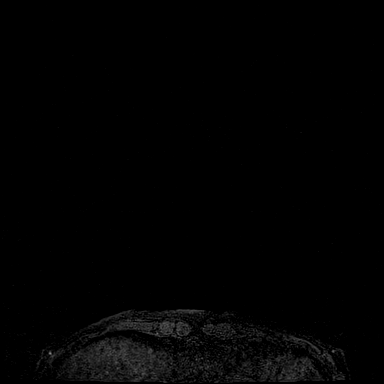
[im 36/144]
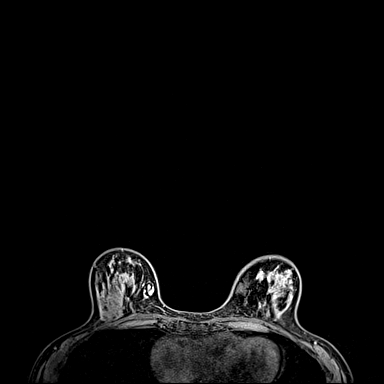
[im 72/144]
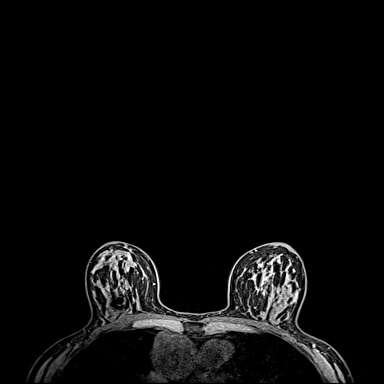
[im 108/144]
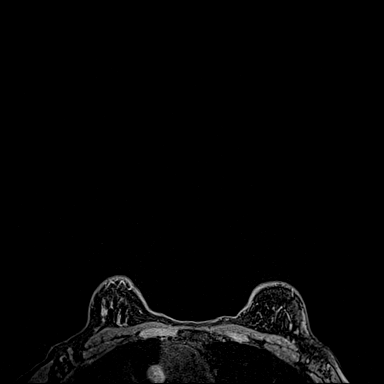
[im 144/144]
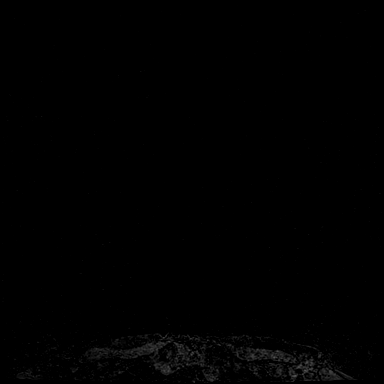

[Series 5: fl3d post immediate · axial · 1.2mm · 0.94mm/px · z∈[-111,+60]mm · 5 of 144 slices shown (1 of 3)]
[im 1/144]
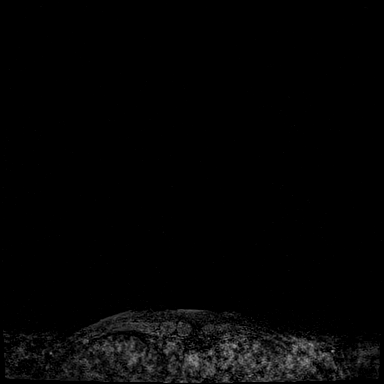
[im 36/144]
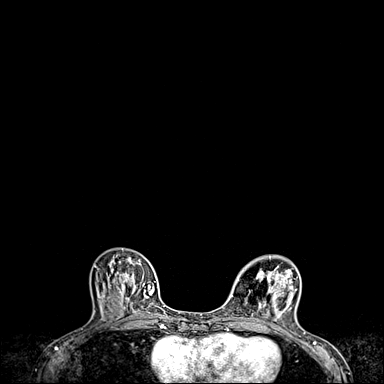
[im 72/144]
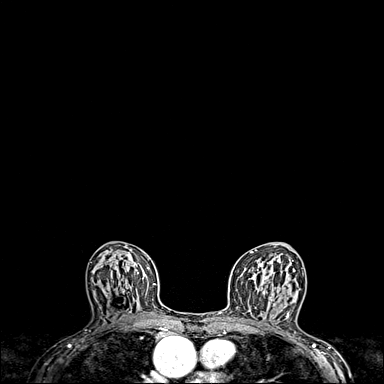
[im 108/144]
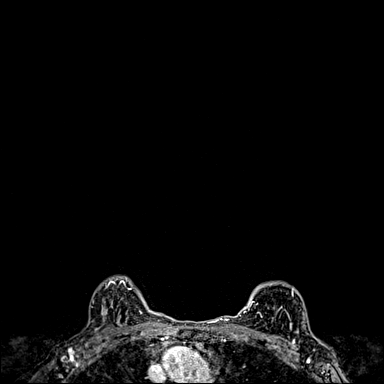
[im 144/144]
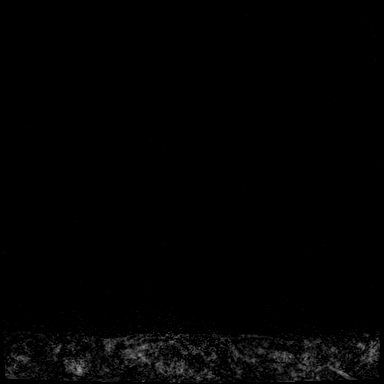

[Series 6: fl3d post immediate · axial · 1.2mm · 0.94mm/px · z∈[-111,+60]mm · 5 of 144 slices shown (2 of 3)]
[im 1/144]
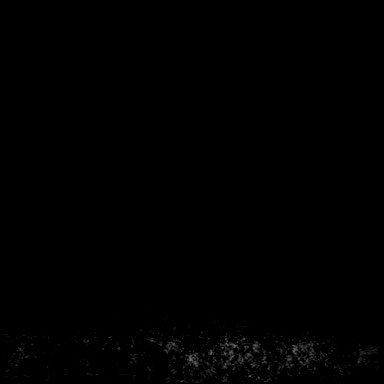
[im 36/144]
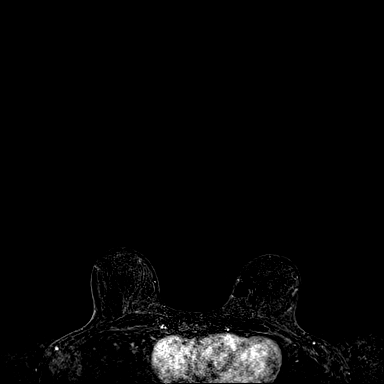
[im 72/144]
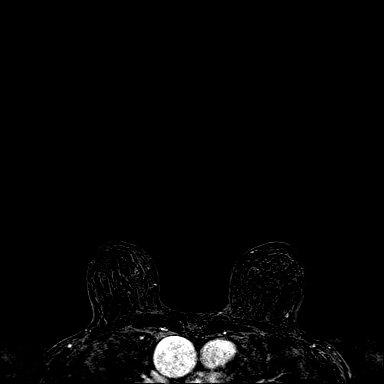
[im 108/144]
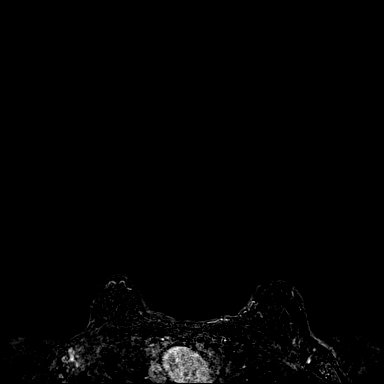
[im 144/144]
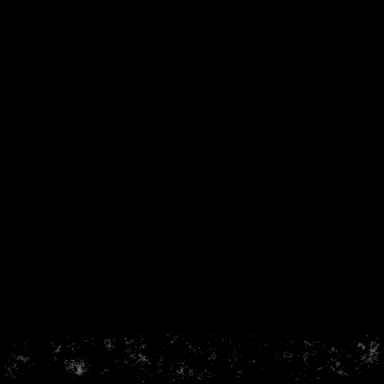

[Series 7: fl3d post immediate · axial · 172.8mm · 0.94mm/px · 1 of 1 slices shown (3 of 3)]
[im 1/1]
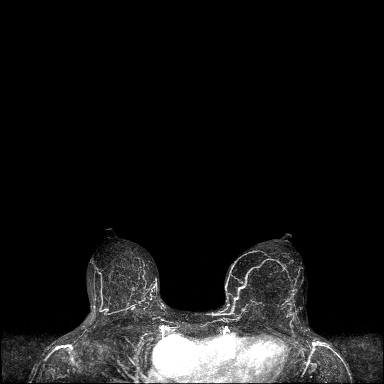

[Series 8: fl3d post 3min · axial · 1.2mm · 0.94mm/px · z∈[-111,+60]mm · 5 of 144 slices shown]
[im 1/144]
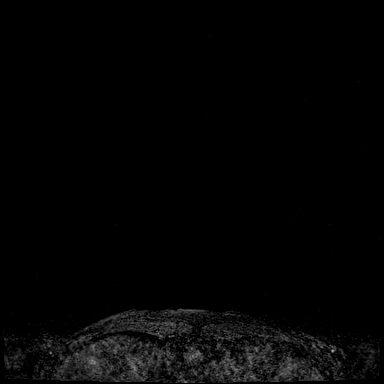
[im 36/144]
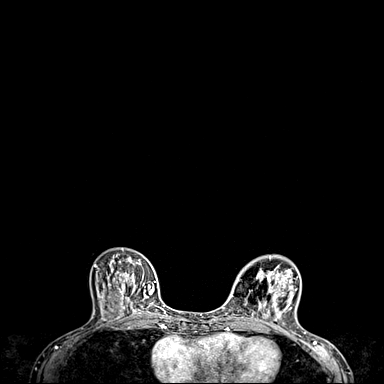
[im 72/144]
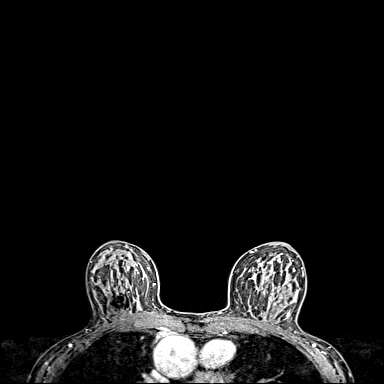
[im 108/144]
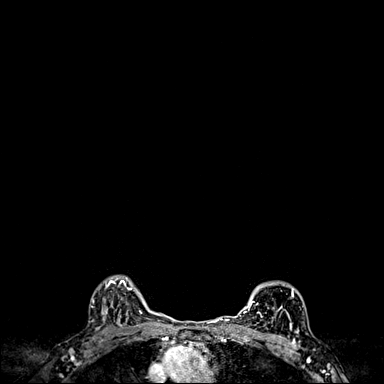
[im 144/144]
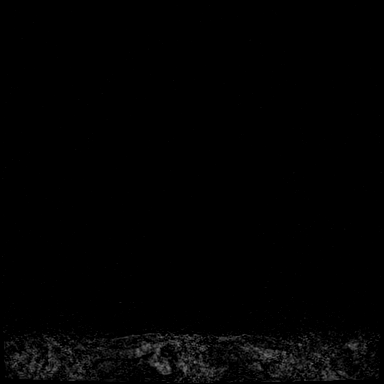

[Series 9: fl3d post 3min_sub · axial · 1.2mm · 0.94mm/px · z∈[-111,+25]mm · 5 of 144 slices shown]
[im 1/144]
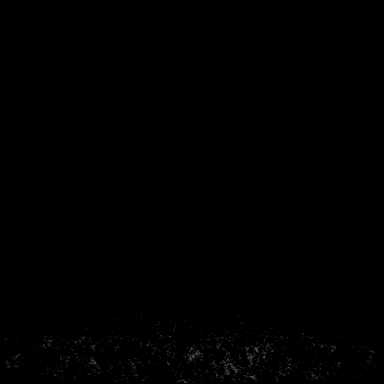
[im 29/144]
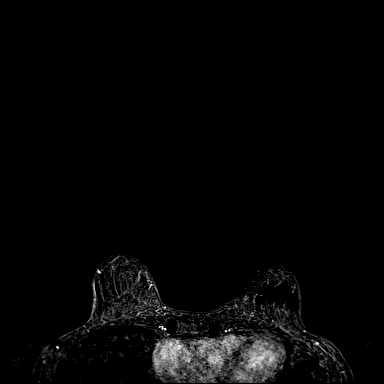
[im 58/144]
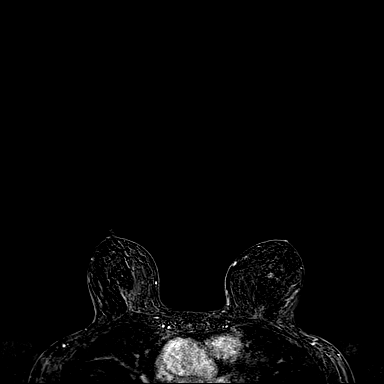
[im 86/144]
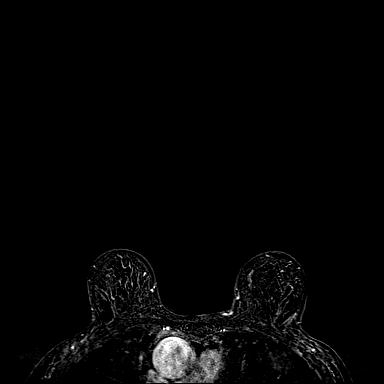
[im 115/144]
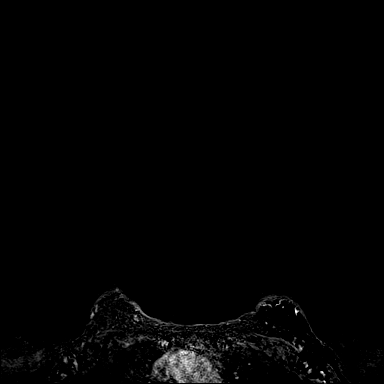

[32 of 48 positions shown; findings below may reference images not displayed]

Three-dimensional MR images were rendered by post-processing of the
original MR data on an independent workstation. The
three-dimensional MR images were interpreted, and findings are
reported in the following complete MRI report for this study. Three
dimensional images were evaluated at the independent interpreting
workstation using the DynaCAD thin client.
FINDINGS: Breast composition: c. Heterogeneous fibroglandular tissue.

Background parenchymal enhancement: Minimal

Right breast: Tissue marker clip is identified within the posterior
UPPER OUTER QUADRANT of the RIGHT breast. There is no enhancement in
this region.

Left breast: Within the central portion of the LEFT breast, just
below the level of the nipple at mid depth, there is enhancing mass
with irregular margins and heterogeneous internal enhancement. Mass
demonstrates washout type kinetics and measures 0.8 centimeters.
(Image 88 series 9). LEFT breast is otherwise negative.

Lymph nodes: No abnormal appearing lymph nodes.

Ancillary findings: The ascending aorta is aneurysmal, measuring
centimeters. (Image 60 of series 12).
IMPRESSION: 1. Post biopsy changes in the posterior UPPER OUTER QUADRANT of the
RIGHT breast, without surrounding enhancement.
2. Indeterminate 0.8 centimeter mass in the central portion of the
LEFT breast, just below the level of the nipple warranting tissue
diagnosis.
3. Ascending aortic aneurysm. Recommend annual imaging followup by
CTA or MRA. This recommendation follows [NT]
ACCF/AHA/AATS/ACR/ASA/SCA/MARIE DARCY/MARIE DARCY/MARIE DARCY/MARIE DARCY Guidelines for the
Diagnosis and Management of Patients with Thoracic Aortic Disease.
Circulation. [NT]; 121: [id number]. Aortic aneurysm NOS ([NT]-[NT])

RECOMMENDATION:
1. Recommend MR guided core biopsy of LEFT breast mass.
2. Recommend annual imaging followup by CTA or MRA for evaluation of
ascending aortic aneurysm.

BI-RADS CATEGORY  4: Suspicious.

## 2021-06-16 MED ORDER — GADOBUTROL 1 MMOL/ML IV SOLN
6.0000 mL | Freq: Once | INTRAVENOUS | Status: AC | PRN
  Administered 2021-06-16: 6 mL via INTRAVENOUS

## 2021-06-23 ENCOUNTER — Other Ambulatory Visit: Payer: Self-pay | Admitting: Surgery

## 2021-06-23 DIAGNOSIS — R9389 Abnormal findings on diagnostic imaging of other specified body structures: Secondary | ICD-10-CM

## 2021-06-24 ENCOUNTER — Ambulatory Visit: Payer: Self-pay | Admitting: Genetic Counselor

## 2021-06-24 ENCOUNTER — Encounter: Payer: Self-pay | Admitting: Genetic Counselor

## 2021-06-24 ENCOUNTER — Telehealth: Payer: Self-pay | Admitting: Genetic Counselor

## 2021-06-24 DIAGNOSIS — Z1379 Encounter for other screening for genetic and chromosomal anomalies: Secondary | ICD-10-CM | POA: Insufficient documentation

## 2021-06-24 NOTE — Progress Notes (Signed)
HPI:   ?Ms. April Sexton was previously seen in the [facility] clinic due to a family history of breast cancer and concerns regarding a hereditary predisposition to cancer. Please refer to our prior cancer genetics clinic note for more information regarding our discussion, assessment and recommendations, at the time. Ms. April Sexton's recent genetic test results were disclosed to her, as were recommendations warranted by these results. These results and recommendations are discussed in more detail below. ? ?CANCER HISTORY:  ?Ms. April Sexton is a 56 y.o. female with no personal history of cancer.  She was found to have right atypical lobular hyperplasia after right breast biopsy this year. She is being followed by Dr. Brantley Stage, who recommends regular breast MRIs based on her high risk status.  ? ? ?FAMILY HISTORY:  ?We obtained a detailed, 4-generation family history.  Significant diagnoses are listed below: ?Family History  ?Problem Relation Age of Onset  ? Breast cancer Mother 22  ?     again at 55  ? Colon cancer Mother 60  ? Colon polyps Mother   ?     less than 10 lifetime  ? Breast cancer Maternal Aunt 58  ?     again at 31  ? Kidney cancer Maternal Aunt 9  ? Asthma Paternal Grandfather   ? Breast cancer Cousin 4  ?     maternal female cousin  ? ?[name] reported that her maternal cousin may have had positive genetic testing, which she stated was de novo.  No report available for review today. Ms. April Sexton is unaware of other previous family history of genetic testing for hereditary cancer risks. Patient's maternal ancestors are of [place] descent, and paternal ancestors are of [place] descent. There is no reported Ashkenazi Jewish ancestry. There is no known consanguinity. ? ?GENETIC TEST RESULTS:  ?The [facility] CancerNext-Expanded +RNAinsight Panel found no pathogenic mutations. The CancerNext-Expanded gene panel offered by [facility] and includes  sequencing, rearrangement, and RNA analysis for the following 77 genes: AIP, ALK, APC, ATM, AXIN2, BAP1, BARD1, BLM, BMPR1A, BRCA1, BRCA2, BRIP1, CDC73, CDH1, CDK4, CDKN1B, CDKN2A, CHEK2, CTNNA1, DICER1, FANCC, FH, FLCN, GALNT12, KIF1B, LZTR1, MAX, MEN1, MET, MLH1, MSH2, MSH3, MSH6, MUTYH, NBN, NF1, NF2, NTHL1, PALB2, PHOX2B, PMS2, POT1, PRKAR1A, PTCH1, PTEN, RAD51C, RAD51D, RB1, RECQL, RET, SDHA, SDHAF2, SDHB, SDHC, SDHD, SMAD4, SMARCA4, SMARCB1, SMARCE1, STK11, SUFU, TMEM127, TP53, TSC1, TSC2, VHL and XRCC2 (sequencing and deletion/duplication); EGFR, EGLN1, HOXB13, KIT, MITF, PDGFRA, POLD1, and POLE (sequencing only); EPCAM and GREM1 (deletion/duplication only).  ?.  ? ?The test report has been scanned into EPIC and is located under the Molecular Pathology section of the Results Review tab.  A portion of the result report is included below for reference. Genetic testing reported out on June 14, 2021.  ? ? ? ?Even though a pathogenic variant was not identified, possible explanations for the cancer in the family may include: ?There may be no hereditary risk for cancer in the family. The cancers in Ms. April Sexton's family may be sporadic/familial or due to other genetic and environmental factors. ?There may be a gene mutation in one of these genes that current testing methods cannot detect but that chance is small. ?There could be another gene that has not yet been discovered, or that we have not yet tested, that is responsible for the cancer diagnoses in the family.  ?It is also possible there is a hereditary cause for the cancer in the family that Ms. April  Tye Sexton did not inherit. ? ?Therefore, it is important to remain in touch with cancer genetics in the future so that we can continue to offer Ms. April Sexton the most up to date genetic testing.  ? ?ADDITIONAL GENETIC TESTING:  ?We discussed with Ms. April Sexton that her genetic testing was fairly extensive.  If there are genes  identified to increase cancer risk that can be analyzed in the future, we would be happy to discuss and coordinate this testing at that time.   ? ?CANCER SCREENING RECOMMENDATIONS:  ?Ms. April Sexton's test result is considered negative (normal).  This means that we have not identified a hereditary cause for her family history of cancer at this time.  ? ?An individual's cancer risk and medical management are not determined by genetic test results alone. Overall cancer risk assessment incorporates additional factors, including personal medical history, family history, and any available genetic information that may result in a personalized plan for cancer prevention and surveillance. Therefore, it is recommended she continue to follow the cancer management and screening guidelines provided by Dr. Brantley Stage and her primary healthcare provider. ? ?RECOMMENDATIONS FOR FAMILY MEMBERS:   ?Since she did not inherit a identifiable mutation in a cancer predisposition gene included on this panel, her children could not have inherited a known mutation from her in one of these genes. ?Individuals in this family might be at some increased risk of developing cancer, over the general population risk, due to the family history of cancer.  Individuals in the family should notify their providers of the family history of cancer. We recommend women in this family have a yearly mammogram beginning at age 9, or 28 years younger than the earliest onset of cancer, an annual clinical breast exam, and perform monthly breast self-exams.   ?Other members of the family may still carry a pathogenic variant in one of these genes that Ms. April Sexton did not inherit. Based on the family history, we recommend her maternal family members with a history of breast cancer have genetic counseling and testing. Ms. April Sexton can let us know if we can be of any assistance in coordinating genetic counseling and/or testing for these  family members.    ? ? ?FOLLOW-UP:  ?Lastly, we discussed with Ms. April Sexton that cancer genetics is a rapidly advancing field and it is possible that new genetic tests will be appropriate for her and/or her family members in the future. We encouraged her to remain in contact with cancer genetics on an annual basis so we can update her personal and family histories and let her know of advances in cancer genetics that may benefit this family.  ? ?Our contact number was provided. Ms. April Sexton's questions were answered to her satisfaction, and she knows she is welcome to call us at anytime with additional questions or concerns.  ? ?Prisilla Kocsis M. Joette Catching, [place], [place] ?Genetic Counselor ?[email or web address] ?(P) [phone] ? ?

## 2021-06-24 NOTE — Telephone Encounter (Signed)
Revealed negative genetic testing.  Discussed that we do not know why there is cancer in the family. It could be sporadic/famillial, due to a change in a gene that she did not inherit, due to a different gene that we are not testing, or maybe our current technology may not be able to pick something up.  It will be important for her to keep in contact with genetics to keep up with whether additional testing may be needed.  Recommended genetic testing for maternal relatives with a history of breast cancer.  ? ? ? ? ? ?

## 2021-07-11 ENCOUNTER — Other Ambulatory Visit (HOSPITAL_COMMUNITY): Payer: Self-pay | Admitting: Diagnostic Radiology

## 2021-07-11 ENCOUNTER — Ambulatory Visit
Admission: RE | Admit: 2021-07-11 | Discharge: 2021-07-11 | Disposition: A | Discharge status: Home / Self Care | Payer: 59 | Source: Ambulatory Visit | Attending: Surgery | Admitting: Surgery

## 2021-07-11 DIAGNOSIS — R9389 Abnormal findings on diagnostic imaging of other specified body structures: Secondary | ICD-10-CM

## 2021-07-11 HISTORY — PX: BREAST BIOPSY: SHX20

## 2021-07-11 IMAGING — MG MM BREAST LOCALIZATION CLIP
4 series · 4 of 12 positions shown · non-contrast
Comparison: Previous exam(s).

CLINICAL DATA: Status post MR guided core biopsy of enhancing mass
in the central portion of the LEFT breast.

EXAM:
3D DIAGNOSTIC LEFT MAMMOGRAM POST MRI BIOPSY

[L ML synth-2D]
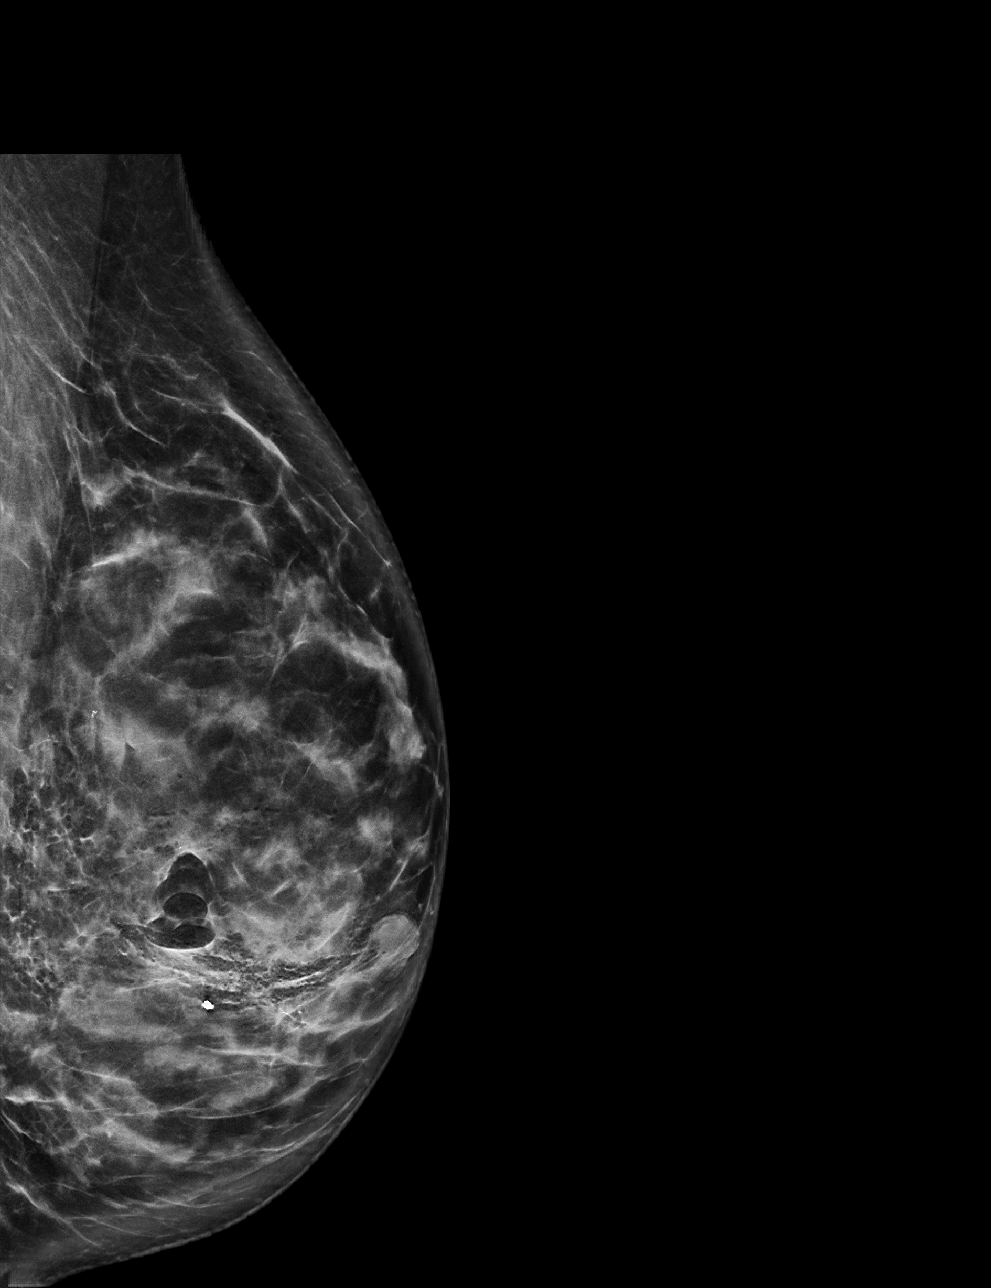

[L CC synth-2D]
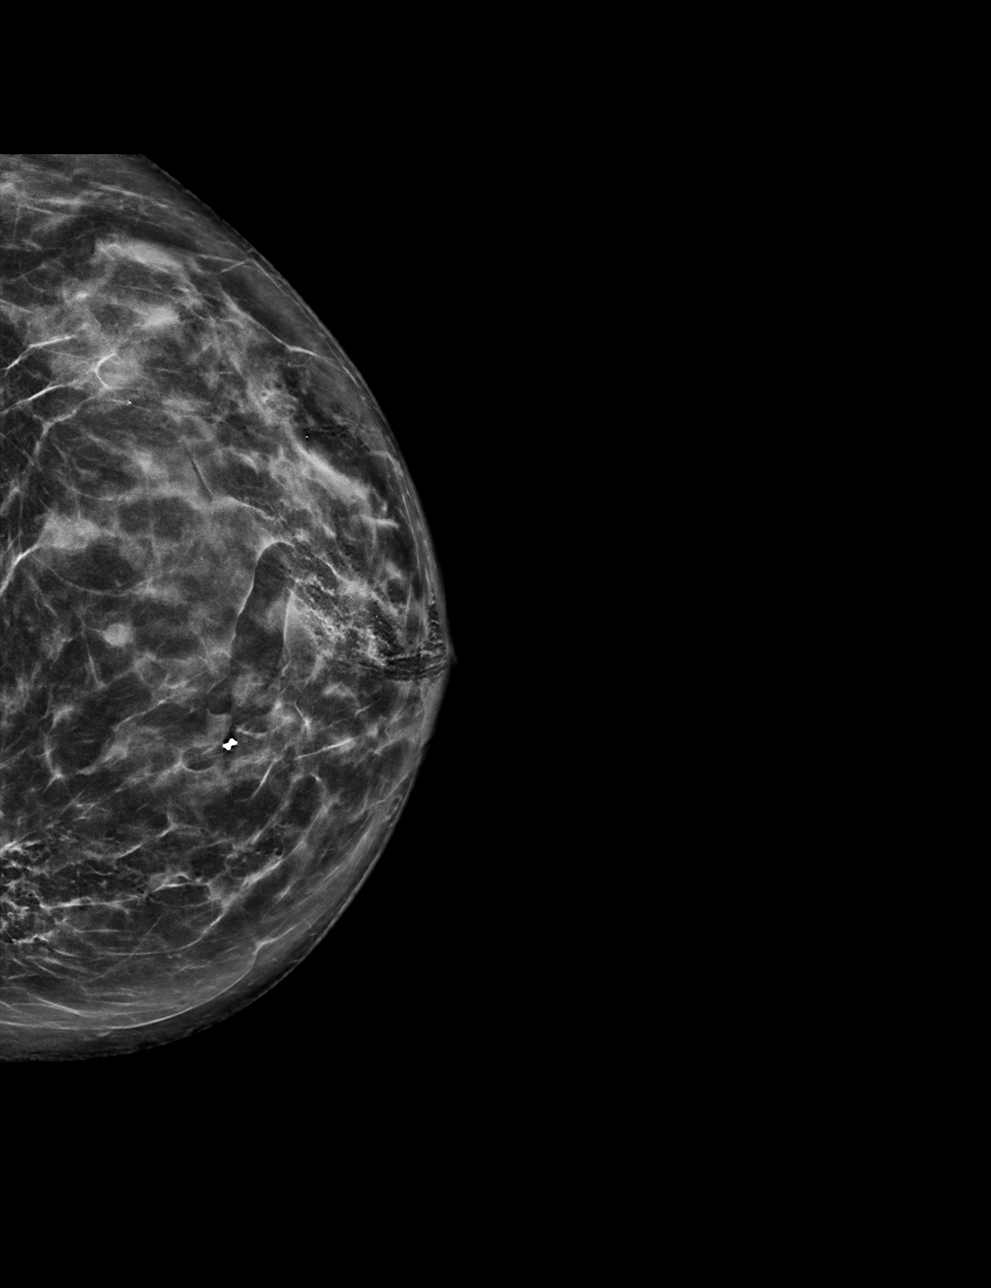

[L CC tomo · tomo slice 36/71.0]
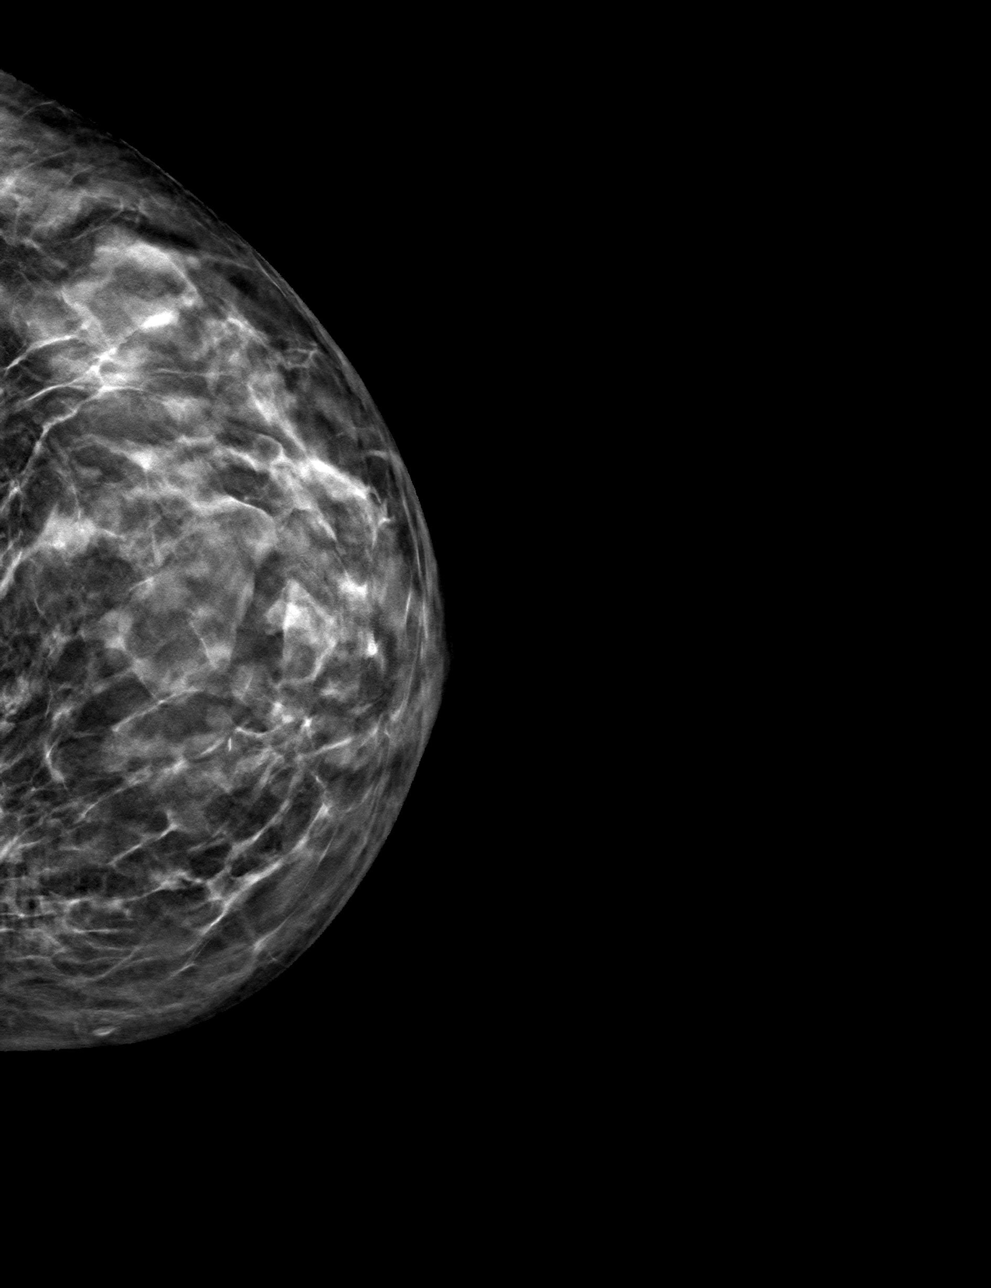

[L ML tomo · tomo slice 35/69.0]
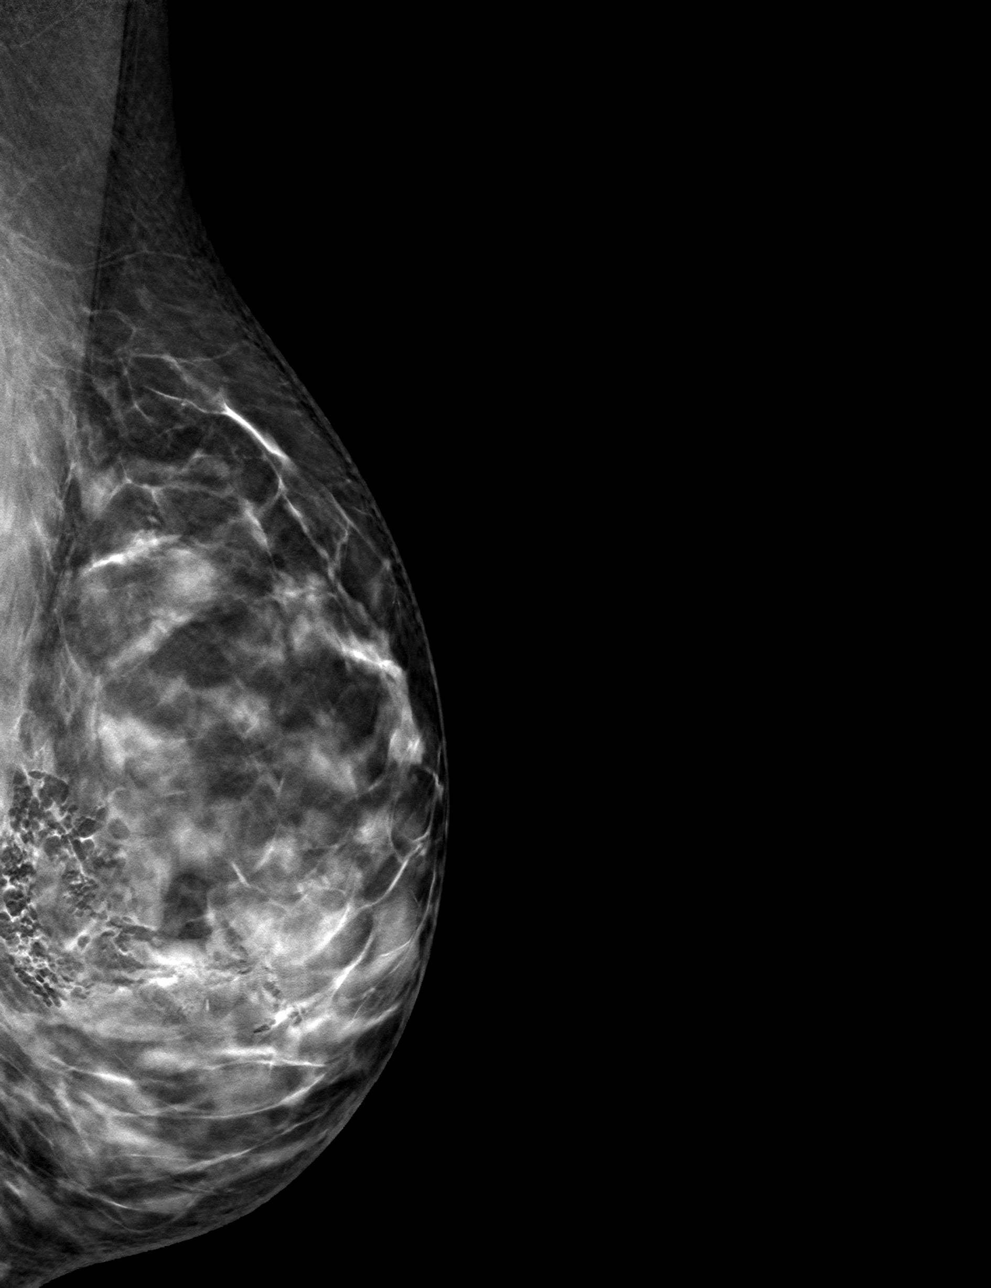

[4 of 12 positions shown; findings below may reference images not displayed]

FINDINGS: 3D Mammographic images were obtained following MRI guided biopsy of
mass in the central portion of the LEFT breast and placement of a
barbell shaped clip. The biopsy marking clip is in expected position
at the site of biopsy.
IMPRESSION: Appropriate positioning of the barbell shaped biopsy marking clip at
the site of biopsy in the central LEFT breast.

Final Assessment: Post Procedure Mammograms for Marker Placement

## 2021-07-11 IMAGING — MR MR BREAST BX W LOC DEV 1ST LESION IMAGE BX SPEC MR GUIDE*L*
8 of 12 series · 32 of 48 positions shown · IV contrast (6ml gadavist)
Comparison: Previous exams.
COMPARISON: Previous exams.

Addendum:
CLINICAL DATA: Patient presents for MR guided core biopsy of LEFT
breast mass.

EXAM:
MRI GUIDED CORE NEEDLE BIOPSY OF THE LEFT BREAST
TECHNIQUE: Multiplanar, multisequence MR imaging of the LEFT breast was
performed both before and after administration of intravenous
contrast.
CONTRAST:  6mL GADAVIST GADOBUTROL 1 MMOL/ML IV SOLN

[Series 2: fiducial unilateral · sagittal · 2.0mm · 1.33mm/px · 2 of 52 slices shown]
[im 1/52]
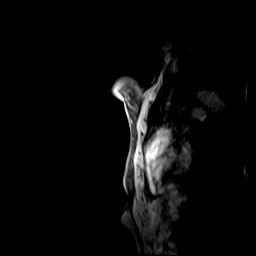
[im 52/52]
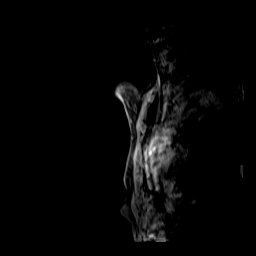

[Series 3: dynamic pre · axial · non-contrast · 1.3mm · 0.73mm/px · z∈[-90,+116]mm · 5 of 160 slices shown]
[im 1/160]
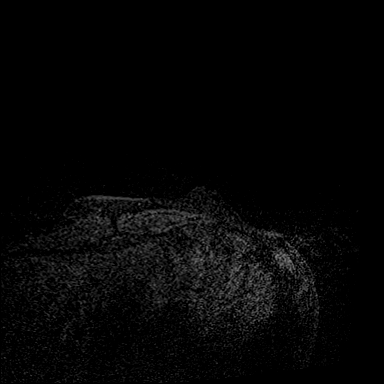
[im 40/160]
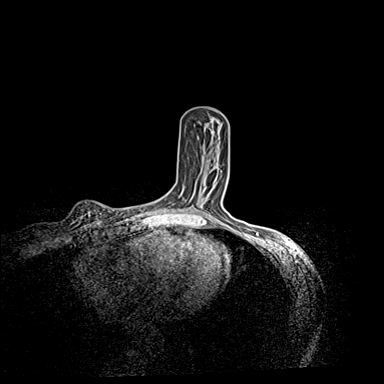
[im 80/160]
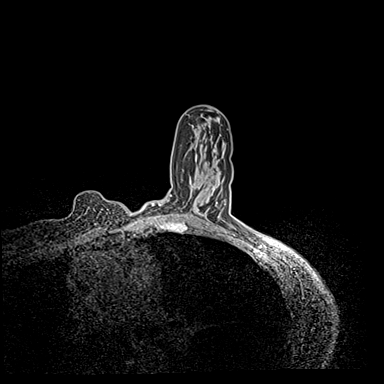
[im 120/160]
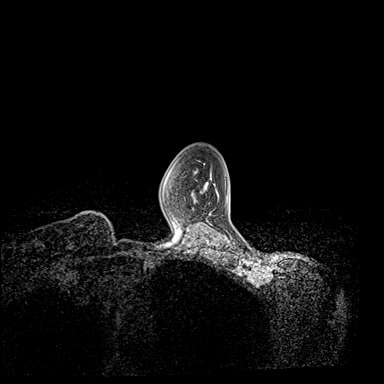
[im 160/160]
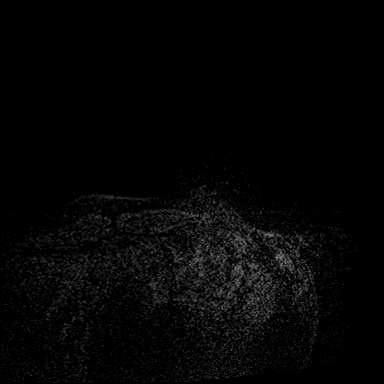

[Series 4: dynamic post 20 · axial · 1.3mm · 0.73mm/px · z∈[-90,+116]mm · 5 of 160 slices shown (1 of 2)]
[im 1/160]
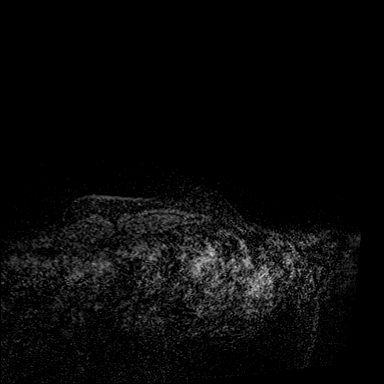
[im 40/160]
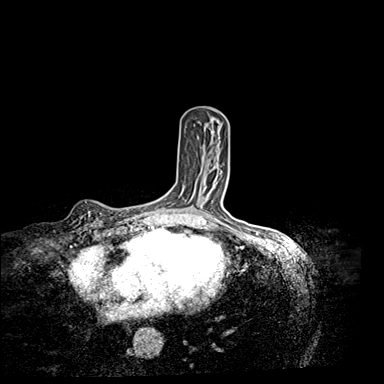
[im 80/160]
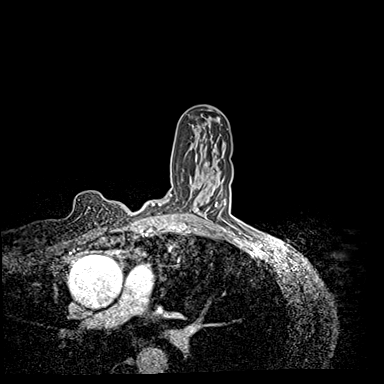
[im 120/160]
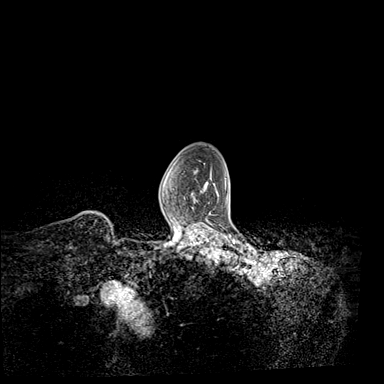
[im 160/160]
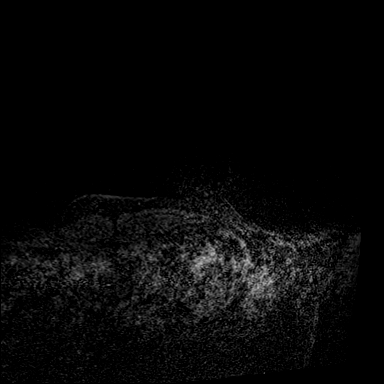

[Series 5: dynamic post 20 · axial · 1.3mm · 0.73mm/px · z∈[-90,+116]mm · 4 of 160 slices shown (2 of 2)]
[im 1/160]
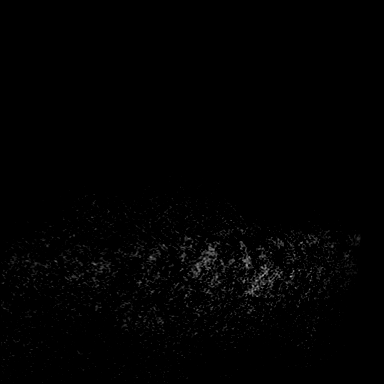
[im 54/160]
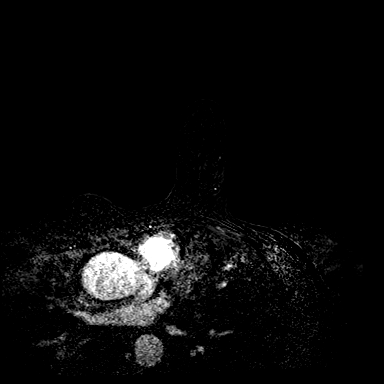
[im 107/160]
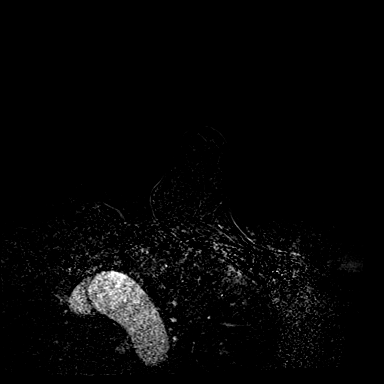
[im 160/160]
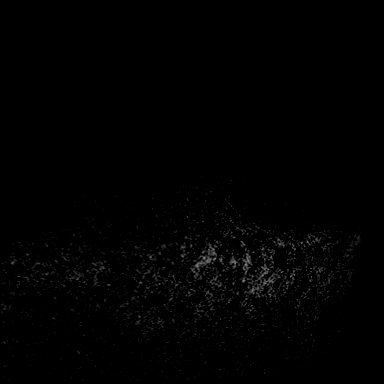

[Series 6: dynamic post 3 · axial · 1.3mm · 0.73mm/px · z∈[-90,+116]mm · 4 of 160 slices shown (1 of 2)]
[im 1/160]
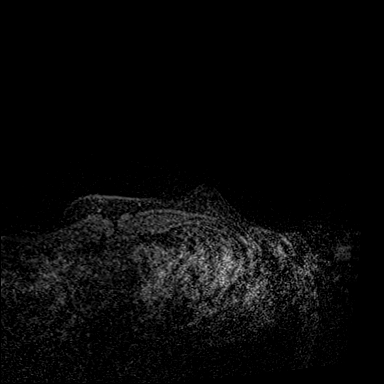
[im 54/160]
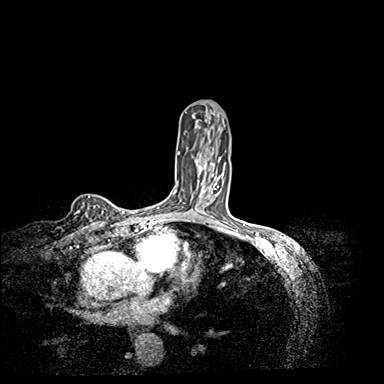
[im 107/160]
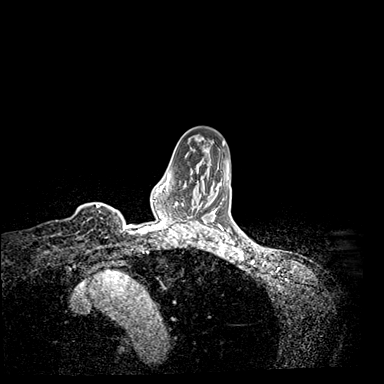
[im 160/160]
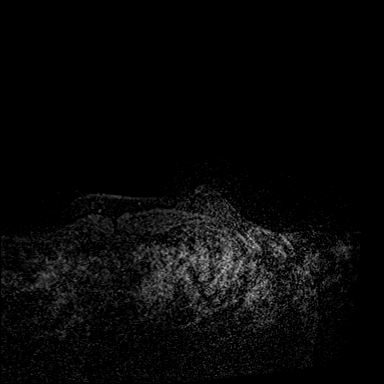

[Series 7: dynamic post 3 · axial · 1.3mm · 0.73mm/px · z∈[-90,+116]mm · 4 of 160 slices shown (2 of 2)]
[im 1/160]
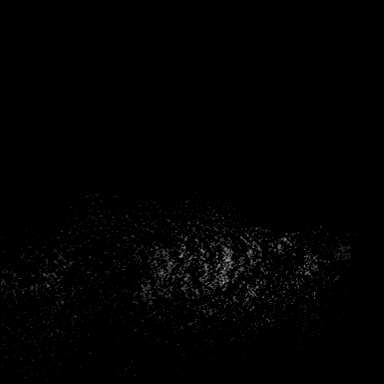
[im 54/160]
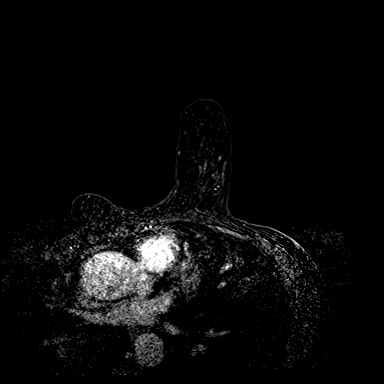
[im 107/160]
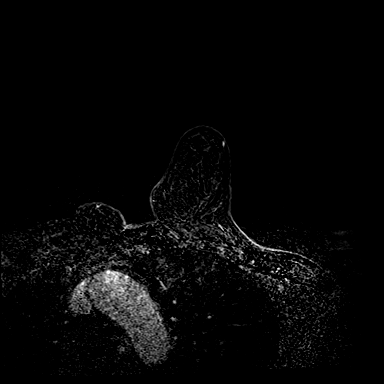
[im 160/160]
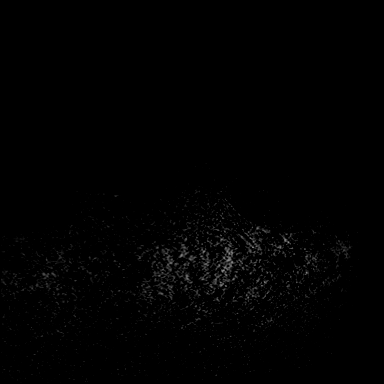

[Series 8: needle confirmation · axial · 1.3mm · 0.73mm/px · z∈[-90,+116]mm · 4 of 160 slices shown]
[im 1/160]
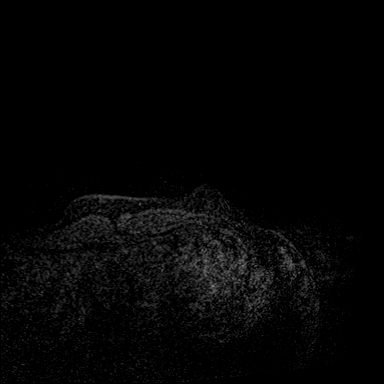
[im 54/160]
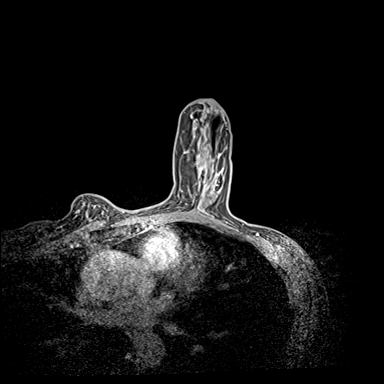
[im 107/160]
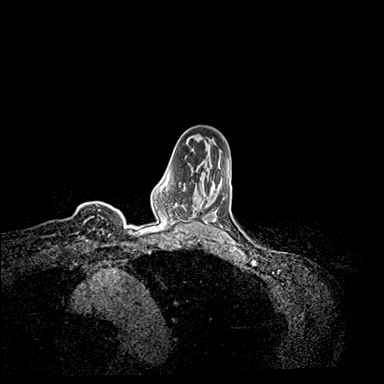
[im 160/160]
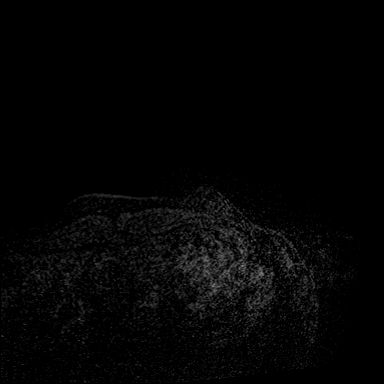

[Series 9: needle confirmation_sub · axial · 1.3mm · 0.73mm/px · z∈[-90,+116]mm · 4 of 160 slices shown]
[im 1/160]
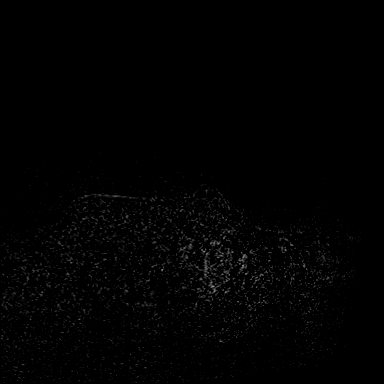
[im 54/160]
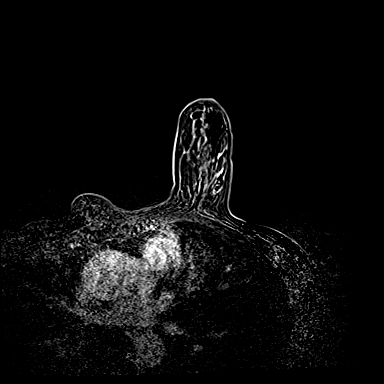
[im 107/160]
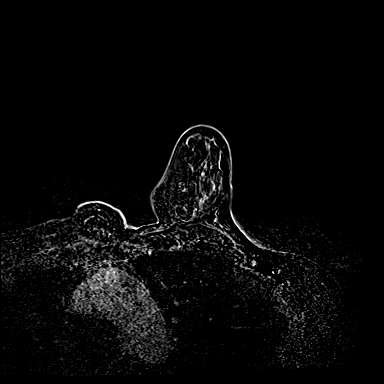
[im 160/160]
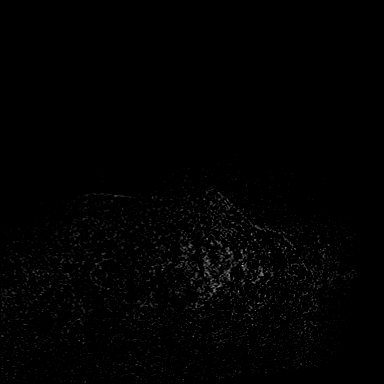

[32 of 48 positions shown; findings below may reference images not displayed]

FINDINGS: I met with the patient, and we discussed the procedure of MRI guided
biopsy, including risks, benefits, and alternatives. Specifically,
we discussed the risks of infection, bleeding, tissue injury, clip
migration, and inadequate sampling. Informed, written consent was
given. The usual time out protocol was performed immediately prior
to the procedure.

Using sterile technique, 1% Lidocaine, MRI guidance, and a 9 gauge
vacuum assisted device, biopsy was performed of enhancing mass in
the central portion of the LEFT breast using a LATERAL to MEDIAL
approach. At the conclusion of the procedure, a barbell tissue
marker clip was deployed into the biopsy cavity. Follow-up 2-view
mammogram was performed and dictated separately.

At the conclusion of the procedure, patient had a vasovagal reaction
with nausea and vomiting. Patient recovered spontaneously and LEFT
the [REDACTED] in good condition.
IMPRESSION: MRI guided biopsy of LEFT breast mass.  No apparent complications.

ADDENDUM:
Pathology revealed ADENOSIS AND FIBROCYSTIC CHANGES WITH
CALCIFICATIONS of the LEFT breast, central, (barbell clip). This was
found to be concordant by Dr. GARTH.

Pathology results were discussed with the patient by telephone. The
patient reported doing well after the biopsy with tenderness and
bruising at the site. Post biopsy instructions and care were
reviewed and questions were answered. The patient was encouraged to
call The [REDACTED] for any additional
concerns. My direct phone number was provided.

The patient has a recent diagnosis of LOBULAR NEOPLASIA (ATYPICAL
LOBULAR HYPERPLASIA) of the RIGHT breast and should follow her
outlined treatment plan.

Dr. GARTH was notified of biopsy results via [REDACTED] message

The patient was instructed to return for a bilateral breast MRI in 6
months, per protocol.

Pathology results reported by GARTH, RN on [DATE].

*** End of Addendum ***
FINDINGS: I met with the patient, and we discussed the procedure of MRI guided
biopsy, including risks, benefits, and alternatives. Specifically,
we discussed the risks of infection, bleeding, tissue injury, clip
migration, and inadequate sampling. Informed, written consent was
given. The usual time out protocol was performed immediately prior
to the procedure.

Using sterile technique, 1% Lidocaine, MRI guidance, and a 9 gauge
vacuum assisted device, biopsy was performed of enhancing mass in
the central portion of the LEFT breast using a LATERAL to MEDIAL
approach. At the conclusion of the procedure, a barbell tissue
marker clip was deployed into the biopsy cavity. Follow-up 2-view
mammogram was performed and dictated separately.

At the conclusion of the procedure, patient had a vasovagal reaction
with nausea and vomiting. Patient recovered spontaneously and LEFT
the [REDACTED] in good condition.
IMPRESSION: MRI guided biopsy of LEFT breast mass.  No apparent complications.

## 2021-07-11 MED ORDER — GADOBUTROL 1 MMOL/ML IV SOLN
6.0000 mL | Freq: Once | INTRAVENOUS | Status: AC | PRN
  Administered 2021-07-11: 6 mL via INTRAVENOUS

## 2021-08-12 ENCOUNTER — Encounter: Payer: Self-pay | Admitting: Family Medicine

## 2021-08-15 ENCOUNTER — Encounter: Payer: Self-pay | Admitting: Family Medicine

## 2021-08-15 ENCOUNTER — Ambulatory Visit: Payer: 59 | Admitting: Family Medicine

## 2021-08-15 VITALS — BP 114/78 | HR 75 | Temp 98.0°F | Ht 64.0 in | Wt 115.0 lb

## 2021-08-15 DIAGNOSIS — G43809 Other migraine, not intractable, without status migrainosus: Secondary | ICD-10-CM

## 2021-08-15 DIAGNOSIS — K130 Diseases of lips: Secondary | ICD-10-CM | POA: Diagnosis not present

## 2021-08-15 MED ORDER — SUMATRIPTAN SUCCINATE 100 MG PO TABS: 100.0000 mg | ORAL_TABLET | ORAL | 11 refills | Status: DC | PRN

## 2021-08-15 NOTE — Progress Notes (Signed)
April Sexton , [birth year], 56 y.o., female MRN: [id number] Patient Care Team    Relationship Specialty Notifications Start End  Ma Hillock, DO PCP - General Family Medicine  01/16/19     Chief Complaint  Patient presents with   Migraine    [facility]; pt is fasting     Subjective: Pt presents for an OV follow-up on her migraine headaches.  Patient reports she takes Imitrex 100 mg when needed.  She usually goes through the allotted 10 tabs monthly, or close.  She states that usually takes 2 doses to clear migraine.  Patient also has concerns over a small skin lesion she noticed above her top lip.  She states this has been there for quite some time when she looked back on some pictures, but she had not noticed initially until it started becoming a little larger.  She would like to have this evaluated today.     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    Arthritis    foot   Chicken pox    COVID 09/2020   Family history of colon cancer 06/05/2021   Family history of kidney cancer 06/05/2021   History of UTI    Menorrhagia 2018   IUD placed for menorrhagia   Migraines    Past Surgical History:  Procedure Laterality Date   BREAST BIOPSY  1995   benign   BREAST BIOPSY Left 07/11/2021   BREAST BIOPSY Right 04/29/2021   BREAST EXCISIONAL BIOPSY Right 1995   HEMANGIOMA EXCISION  2008   Right Jaw     Family History  Problem Relation Age of Onset   Arthritis Mother    Breast cancer Mother 62       again at 81   Diabetes Mother    Hypertension Mother    Colon cancer Mother 77   Colon polyps Mother        less than 58 lifetime   Alcohol abuse Father    Early death Father    Hyperlipidemia Sister    Alcohol abuse Brother    Hypertension Brother    Colon polyps Brother    Breast cancer Maternal Aunt 58       again at 79   Kidney cancer Maternal Aunt 75   Asthma Paternal Grandfather    Breast cancer Cousin 4       maternal female cousin   Esophageal cancer Neg Hx    Stomach cancer Neg Hx    Rectal cancer Neg Hx    Allergies as of 08/15/2021       Reactions   Lactose Intolerance (gi) Diarrhea        Medication List        Accurate as of August 15, 2021 10:01 AM. If  you have any questions, ask your nurse or doctor.          b complex vitamins capsule Take 1 capsule by mouth daily.   Calcium 200 MG Tabs Take 3 tablets by mouth daily.   co-enzyme Q-10 30 MG capsule Take 100 mg by mouth daily.   Iron 325 (65 Fe) MG Tabs Take 1 tablet by mouth daily.   levonorgestrel 20 MCG/24HR IUD Commonly known as: MIRENA 1 each by Intrauterine route once.   Magnesium 400 MG Caps Take 1 capsule by mouth daily.   Melatonin 2.5 MG Caps Take 2 capsules by mouth at bedtime.   MULTIVITAL PO Take by mouth.   SUMAtriptan 100 MG tablet Commonly known as: IMITREX Take 1 tablet (100 mg total) by mouth every 2 (two) hours as needed for migraine. May repeat in 2 hours, once,  if headache persists or recurs.   Vitamin D3 10 MCG (400 UNIT) Caps Take 2 capsules by mouth daily.        All past medical history, surgical history, allergies, family history, immunizations andmedications were updated in the EMR today and reviewed under the history and medication portions of their EMR.     ROS Negative, with the exception of above mentioned in HPI   Objective:  BP 114/78    Pulse 75   Temp 98 F (36.7 C) (Oral)   Ht '5\' 4"'$  (1.626 m)   Wt 115 lb (52.2 kg)   SpO2 98%   BMI 19.74 kg/m  Body mass index is 19.74 kg/m.  Physical Exam Vitals and nursing note reviewed.  Constitutional:      General: She is not in acute distress.    Appearance: Normal appearance. She is normal weight. She is not ill-appearing or toxic-appearing.  HENT:     Head: Normocephalic and atraumatic.     Mouth/Throat:     Lips: Pink. Lesions present.      Comments: Small area of erythema along and extended through [place] border left upperlip near midline.   Eyes:     Extraocular Movements: Extraocular movements intact.     Conjunctiva/sclera: Conjunctivae normal.     Pupils: Pupils are equal, round, and reactive to light.  Neurological:     Mental Status: She is alert and oriented to person, place, and time. Mental status is at baseline.  Psychiatric:        Mood and Affect: Mood normal.        Behavior: Behavior normal.        Thought Content: Thought content normal.        Judgment: Judgment normal.    No results found. No results found. No results found for this or any previous visit (from the past 24 hour(s)).  Assessment/Plan: April Sexton is a 56 y.o. female present for OV for  migraine without status migrainosus, not intractable Stable.  Continue Imitrex as needed  Lip lesion New problem. Discussed the possibility of basal cell or squamous cell in this location.  She would like referral to dermatology for further evaluation and consider for biopsy.  This was placed for her today. - Ambulatory referral to Dermatology  Reviewed expectations re: course of current medical issues. Discussed self-management of symptoms. Outlined signs and symptoms indicating need for more acute intervention. Patient verbalized understanding and all questions were answered. Patient received an After-Visit Summary.    No orders of the defined types were placed in  this encounter.  No orders of the defined types  were placed in this encounter.  Referral Orders  No referral(s) requested today     Note is dictated utilizing voice recognition software. Although note has been proof read prior to signing, occasional typographical errors still can be missed. If any questions arise, please do not hesitate to call for verification.   electronically signed by:  Howard Pouch, DO  [facility]

## 2021-08-15 NOTE — Patient Instructions (Signed)
Migraine Headache A migraine headache is an intense, throbbing pain on one side or both sides of the head. Migraine headaches may also cause other symptoms, such as nausea, vomiting, and sensitivity to light and noise. A migraine headache can last from 4 hours to 3 days. Talk with your doctor about what things may bring on (trigger) your migraine headaches. What are the causes? The exact cause of this condition is not known. However, a migraine may be caused when nerves in the brain become irritated and release chemicals that cause inflammation of blood vessels. This inflammation causes pain. This condition may be triggered or caused by: Drinking alcohol. Smoking. Taking medicines, such as: Medicine used to treat chest pain (nitroglycerin). Birth control pills. Estrogen. Certain blood pressure medicines. Eating or drinking products that contain nitrates, glutamate, aspartame, or tyramine. Aged cheeses, chocolate, or caffeine may also be triggers. Doing physical activity. Other things that may trigger a migraine headache include: Menstruation. Pregnancy. Hunger. Stress. Lack of sleep or too much sleep. Weather changes. Fatigue. What increases the risk? The following factors may make you more likely to experience migraine headaches: Being a certain age. This condition is more common in people who are 25-55 years old. Being female. Having a family history of migraine headaches. Being Caucasian. Having a mental health condition, such as depression or anxiety. Being obese. What are the signs or symptoms? The main symptom of this condition is pulsating or throbbing pain. This pain may: Happen in any area of the head, such as on one side or both sides. Interfere with daily activities. Get worse with physical activity. Get worse with exposure to bright lights or loud noises. Other symptoms may include: Nausea. Vomiting. Dizziness. General sensitivity to bright lights, loud noises, or  smells. Before you get a migraine headache, you may get warning signs (an aura). An aura may include: Seeing flashing lights or having blind spots. Seeing bright spots, halos, or zigzag lines. Having tunnel vision or blurred vision. Having numbness or a tingling feeling. Having trouble talking. Having muscle weakness. Some people have symptoms after a migraine headache (postdromal phase), such as: Feeling tired. Difficulty concentrating. How is this diagnosed? A migraine headache can be diagnosed based on: Your symptoms. A physical exam. Tests, such as: CT scan or an MRI of the head. These imaging tests can help rule out other causes of headaches. Taking fluid from the spine (lumbar puncture) and analyzing it (cerebrospinal fluid analysis, or CSF analysis). How is this treated? This condition may be treated with medicines that: Relieve pain. Relieve nausea. Prevent migraine headaches. Treatment for this condition may also include: Acupuncture. Lifestyle changes like avoiding foods that trigger migraine headaches. Biofeedback. Cognitive behavioral therapy. Follow these instructions at home: Medicines Take over-the-counter and prescription medicines only as told by your health care provider. Ask your health care provider if the medicine prescribed to you: Requires you to avoid driving or using heavy machinery. Can cause constipation. You may need to take these actions to prevent or treat constipation: Drink enough fluid to keep your urine pale yellow. Take over-the-counter or prescription medicines. Eat foods that are high in fiber, such as beans, whole grains, and fresh fruits and vegetables. Limit foods that are high in fat and processed sugars, such as fried or sweet foods. Lifestyle Do not drink alcohol. Do not use any products that contain nicotine or tobacco, such as cigarettes, e-cigarettes, and chewing tobacco. If you need help quitting, ask your health care  provider. Get at least 8   hours of sleep every night. Find ways to manage stress, such as meditation, deep breathing, or yoga. General instructions     Keep a journal to find out what may trigger your migraine headaches. For example, write down: What you eat and drink. How much sleep you get. Any change to your diet or medicines. If you have a migraine headache: Avoid things that make your symptoms worse, such as bright lights. It may help to lie down in a dark, quiet room. Do not drive or use heavy machinery. Ask your health care provider what activities are safe for you while you are experiencing symptoms. Keep all follow-up visits as told by your health care provider. This is important. Contact a health care provider if: You develop symptoms that are different or more severe than your usual migraine headache symptoms. You have more than 15 headache days in one month. Get help right away if: Your migraine headache becomes severe. Your migraine headache lasts longer than 72 hours. You have a fever. You have a stiff neck. You have vision loss. Your muscles feel weak or like you cannot control them. You start to lose your balance often. You have trouble walking. You faint. You have a seizure. Summary A migraine headache is an intense, throbbing pain on one side or both sides of the head. Migraines may also cause other symptoms, such as nausea, vomiting, and sensitivity to light and noise. This condition may be treated with medicines and lifestyle changes. You may also need to avoid certain things that trigger a migraine headache. Keep a journal to find out what may trigger your migraine headaches. Contact your health care provider if you have more than 15 headache days in a month or you develop symptoms that are different or more severe than your usual migraine headache symptoms. This information is not intended to replace advice given to you by your health care provider. Make sure  you discuss any questions you have with your health care provider. Document Revised: 06/24/2018 Document Reviewed: 04/14/2018 [employer] Patient Education  2023 [employer]  

## 2021-11-07 ENCOUNTER — Other Ambulatory Visit: Payer: Self-pay | Admitting: Surgery

## 2021-11-07 DIAGNOSIS — N6099 Unspecified benign mammary dysplasia of unspecified breast: Secondary | ICD-10-CM

## 2022-01-05 ENCOUNTER — Other Ambulatory Visit: Payer: 59

## 2022-01-16 ENCOUNTER — Encounter: Payer: 59 | Admitting: Family Medicine

## 2022-01-26 ENCOUNTER — Ambulatory Visit (INDEPENDENT_AMBULATORY_CARE_PROVIDER_SITE_OTHER): Payer: 59 | Admitting: Family Medicine

## 2022-01-26 ENCOUNTER — Encounter: Payer: Self-pay | Admitting: Family Medicine

## 2022-01-26 VITALS — BP 123/87 | HR 67 | Temp 98.0°F | Ht 64.57 in | Wt 119.8 lb

## 2022-01-26 DIAGNOSIS — G43809 Other migraine, not intractable, without status migrainosus: Secondary | ICD-10-CM

## 2022-01-26 DIAGNOSIS — G43009 Migraine without aura, not intractable, without status migrainosus: Secondary | ICD-10-CM

## 2022-01-26 DIAGNOSIS — Z13220 Encounter for screening for lipoid disorders: Secondary | ICD-10-CM

## 2022-01-26 DIAGNOSIS — Z803 Family history of malignant neoplasm of breast: Secondary | ICD-10-CM

## 2022-01-26 DIAGNOSIS — Z0000 Encounter for general adult medical examination without abnormal findings: Secondary | ICD-10-CM | POA: Diagnosis not present

## 2022-01-26 DIAGNOSIS — Z79899 Other long term (current) drug therapy: Secondary | ICD-10-CM

## 2022-01-26 MED ORDER — SUMATRIPTAN SUCCINATE 100 MG PO TABS: 100.0000 mg | ORAL_TABLET | ORAL | 11 refills | Status: AC | PRN

## 2022-01-26 NOTE — Progress Notes (Signed)
Patient ID: April Sexton, female  DOB: 02/24/1966, 56 y.o.   MRN: [id number] Patient Care Team    Relationship Specialty Notifications Start End  Ma Hillock, DO PCP - General Family Medicine  01/16/19     Chief Complaint  Patient presents with   Annual Exam    Pt is not fasting    Subjective: April Sexton is a 56 y.o.  Female  present for CPE . All past medical history, surgical history, allergies, family history, immunizations, medications and social history were updated in the electronic medical record today. All recent labs, ED visits and hospitalizations within the last year were reviewed.  Health maintenance:  Colonoscopy: colonoscopy 04/2020 - rpt 3 yrs. Dr. Hilarie Sexton.  Mammogram: completed: 04/2021- suspicious w/ bx- Mother Fhx breast cancer in her 56s.  > no malignancy identified. Cervical cancer screening: 12/2019- wnl- neg hpv Immunizations: tdap 12/2018, Influenza 11/2021 (encouraged yearly),  shingrix series completed. Pfizer x3. DTP-diphtheria, tetanus, polio 05/16/2017 Typhoid-Typhim Vi completed 05/16/2017.  Next dose due April 29, 2020. cholera- Dukoral next dose due April 30, 2019 Hepatitis B series completed in 2010. Infectious disease screening: HIV completed/ Hep C  completed DEXA: routine screen.  Assistive device: none Oxygen HEN:IDPO Patient has a Dental home. Hospitalizations/ED visits: reviewed     01/17/2021   12:56 PM 01/12/2020    2:34 PM 01/16/2019   11:06 AM  Depression screen PHQ 2/9  Decreased Interest 0 0 0  Down, Depressed, Hopeless 0 0 0  PHQ - 2 Score 0 0 0       No data to display          Immunization History  Administered Date(s) Administered   DTP 05/16/2017   DTaP 04/29/1966, 05/28/1966, 06/28/1966, 06/27/1969, 02/27/1980   Hepatitis B 01/22/2009, 02/04/2009, 03/28/2009   Influenza,inj,Quad PF,6+ Mos 12/29/2018, 12/12/2021   Influenza-Unspecified 01/05/2020, 12/07/2020   MMR  01/02/2019   PFIZER Comirnaty(Gray Top)Covid-19 Tri-Sucrose Vaccine 12/12/2021   PFIZER(Purple Top)SARS-COV-2 Vaccination 05/26/2019, 06/16/2019, 01/05/2020, 07/28/2020   Pfizer Covid-19 Vaccine Bivalent Booster 49yr & up 12/08/2020   Tdap 01/02/2019   Typhoid Inactivated 05/16/2017   Varicella 01/02/2019   Zoster Recombinat (Shingrix) 01/16/2019, 01/17/2021    Past Medical History:  Diagnosis Date   Allergy    hayfever/birch tree allergy.    Arthritis    foot   Chicken pox    COVID 09/2020   Family history of colon cancer 06/05/2021   Family history of kidney cancer 06/05/2021   History of UTI    Menorrhagia 2018   IUD placed for menorrhagia   Migraines    Allergies  Allergen Reactions   Lactose Intolerance (Gi) Diarrhea   Past Surgical History:  Procedure Laterality Date   BREAST BIOPSY  1995   benign   BREAST BIOPSY Left 07/11/2021   BREAST BIOPSY Right 04/29/2021   BREAST EXCISIONAL BIOPSY Right 1995   HEMANGIOMA EXCISION  2008   Right Jaw    Family History  Problem Relation Age of Onset   Arthritis Mother    Breast cancer Mother 673      again at 769  Diabetes Mother    Hypertension Mother    Colon cancer Mother 864  Colon polyps Mother        less than 132lifetime   Alcohol abuse Father    Early death Father    Hyperlipidemia Sister    Alcohol abuse Brother    Hypertension Brother  Colon polyps Brother    Breast cancer Maternal Aunt 58       again at 37   Kidney cancer Maternal Aunt 75   Asthma Paternal Grandfather    Breast cancer Cousin 61       maternal female cousin   Esophageal cancer Neg Hx    Stomach cancer Neg Hx    Rectal cancer Neg Hx    Social History   Social History Narrative   Marital status/children/pets: married, 1 child   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 If you have any questions, ask your nurse or doctor.          b complex vitamins capsule Take 1 capsule by mouth daily.   Calcium 200 MG Tabs Take 3 tablets by mouth daily.   co-enzyme Q-10 30 MG capsule Take 100 mg by mouth daily.   Iron 325 (65 Fe) MG Tabs Take 1 tablet by mouth daily.   levonorgestrel 20 MCG/24HR IUD Commonly known as: MIRENA 1 each by Intrauterine route once.   Magnesium 400 MG Caps Take 1 capsule by mouth daily.   Melatonin 2.5 MG Caps Take 2 capsules by mouth at bedtime.   MULTIVITAL PO Take by mouth.   SUMAtriptan 100 MG tablet Commonly known as: IMITREX Take 1 tablet (100 mg total) by mouth every 2 (two) hours as needed for migraine. May repeat in 2 hours, once,  if headache persists or recurs.   Vitamin D3 10 MCG (400 UNIT) Caps Take 2 capsules by mouth daily.        All past medical history, surgical history, allergies, family history, immunizations andmedications were updated in the EMR today and reviewed under the history and medication portions of their EMR.     No results found for this or any previous visit (from the past 2160 hour(s)).  MM 3D SCREEN BREAST BILATERAL  Result Date: 04/05/2020 CLINICAL DATA:  Screening. The technologist was told by the patient that she has an enlarging skin lesion near her RIGHT areola. EXAM: DIGITAL SCREENING BILATERAL MAMMOGRAM WITH TOMO AND CAD COMPARISON:  Previous exam(s). ACR Breast Density Category c: The breast tissue is heterogeneously dense, which may obscure small masses. FINDINGS: There are no findings suspicious for malignancy. The images were evaluated with computer-aided detection. IMPRESSION: No mammographic evidence of malignancy. A result letter of this screening mammogram will be mailed directly to the patient. RECOMMENDATION: Recommend clinical  follow-up or dermatology consultation for reported RIGHT breast skin lesion. Screening mammogram in one year. (Code:SM-B-01Y) BI-RADS CATEGORY  1: Negative. Electronically Signed   By: April Sexton M.D.   On: 04/05/2020 10:15     ROS: 14 pt review of systems performed and negative (unless mentioned in an HPI)  Objective: BP 123/87   Pulse 67   Temp 98 F (36.7 C)   Ht 5' 4.57" (1.64 m)   Wt 119 lb 12.8 oz (54.3 kg)   SpO2 97%   BMI 20.20 kg/m  Physical Exam Vitals and nursing note reviewed.  Constitutional:  General: She is not in acute distress.    Appearance: Normal appearance. She is not ill-appearing or toxic-appearing.  HENT:     Head: Normocephalic and atraumatic.     Right Ear: Tympanic membrane, ear canal and external ear normal. There is no impacted cerumen.     Left Ear: Tympanic membrane, ear canal and external ear normal. There is no impacted cerumen.     Nose: No congestion or rhinorrhea.     Mouth/Throat:     Mouth: Mucous membranes are moist.     Pharynx: Oropharynx is clear. No oropharyngeal exudate or posterior oropharyngeal erythema.  Eyes:     General: No scleral icterus.       Right eye: No discharge.        Left eye: No discharge.     Extraocular Movements: Extraocular movements intact.     Conjunctiva/sclera: Conjunctivae normal.     Pupils: Pupils are equal, round, and reactive to light.  Cardiovascular:     Rate and Rhythm: Normal rate and regular rhythm.     Pulses: Normal pulses.     Heart sounds: Normal heart sounds. No murmur heard.    No friction rub. No gallop.  Pulmonary:     Effort: Pulmonary effort is normal. No respiratory distress.     Breath sounds: Normal breath sounds. No stridor. No wheezing, rhonchi or rales.  Chest:     Chest wall: No tenderness.  Abdominal:     General: Abdomen is flat. Bowel sounds are normal. There is no distension.     Palpations: Abdomen is soft. There is no mass.     Tenderness: There is no abdominal  tenderness. There is no right CVA tenderness, left CVA tenderness, guarding or rebound.     Hernia: No hernia is present.  Musculoskeletal:        General: No swelling, tenderness or deformity. Normal range of motion.     Cervical back: Normal range of motion and neck supple. No rigidity or tenderness.     Right lower leg: No edema.     Left lower leg: No edema.  Lymphadenopathy:     Cervical: No cervical adenopathy.  Skin:    General: Skin is warm and dry.     Coloration: Skin is not jaundiced or pale.     Findings: No bruising, erythema, lesion or rash.  Neurological:     General: No focal deficit present.     Mental Status: She is alert and oriented to person, place, and time. Mental status is at baseline.     Cranial Nerves: No cranial nerve deficit.     Sensory: No sensory deficit.     Motor: No weakness.     Coordination: Coordination normal.     Gait: Gait normal.     Deep Tendon Reflexes: Reflexes normal.  Psychiatric:        Mood and Affect: Mood normal.        Behavior: Behavior normal.        Thought Content: Thought content normal.        Judgment: Judgment normal.     No results found.  Assessment/plan: April Sexton is a 56 y.o. female present for CPE migraine without status migrainosus, not intractable/Encounter for long-term current use of medication - imitrex prn Stable  CBC with Differential/Platelet - Comprehensive metabolic panel - TSH Lipid screening - Lipid panel Encounter for long-term current use of medication - CBC with Differential/Platelet - Comprehensive me tabolic panel -  Hemoglobin A1c Routine general medical examination at a health care facility Colonoscopy: colonoscopy 04/2020 - rpt 3 yrs. Dr. Hilarie Sexton.  Mammogram: completed: 04/2021- suspicious w/ bx- Mother Fhx breast cancer in her 56s.  > no malignancy identified. Cervical cancer screening: 12/2019- wnl- neg hpv Immunizations: tdap 12/2018, Influenza 11/2021 (encouraged  yearly),  shingrix series completed. Pfizer x3. DTP-diphtheria, tetanus, polio 05/16/2017 Typhoid-Typhim Vi completed 05/16/2017.  Next dose due April 29, 2020. cholera- Dukoral next dose due April 30, 2019 Hepatitis B series completed in 2010. Infectious disease screening: HIV completed/ Hep C  completed DEXA: routine screen.  Patient was encouraged to exercise greater than 150 minutes a week. Patient was encouraged to choose a diet filled with fresh fruits and vegetables, and lean meats. AVS provided to patient today for education/recommendation on gender specific health and safety maintenance.     No follow-ups on file.  Orders Placed This Encounter  Procedures   CBC with Differential/Platelet   Comprehensive metabolic panel   Hemoglobin A1c   Lipid panel   TSH   Meds ordered this encounter  Medications   SUMAtriptan (IMITREX) 100 MG tablet    Sig: Take 1 tablet (100 mg total) by mouth every 2 (two) hours as needed for migraine. May repeat in 2 hours, once,  if headache persists or recurs.    Dispense:  10 tablet    Refill:  11   Referral Orders  No referral(s) requested today     Electronically signed by: Howard Pouch, [place]

## 2022-01-26 NOTE — Patient Instructions (Signed)
No follow-ups on file.        Great to see you today.  I have refilled the medication(s) we provide.   If labs were collected, we will inform you of lab results once received either by echart message or telephone call.   - echart message- for normal results that have been seen by the patient already.   - telephone call: abnormal results or if patient has not viewed results in their echart.  Health Maintenance, Female Adopting a healthy lifestyle and getting preventive care are important in promoting health and wellness. Ask your health care provider about: The right schedule for you to have regular tests and exams. Things you can do on your own to prevent diseases and keep yourself healthy. What should I know about diet, weight, and exercise? Eat a healthy diet  Eat a diet that includes plenty of vegetables, fruits, low-fat dairy products, and lean protein. Do not eat a lot of foods that are high in solid fats, added sugars, or sodium. Maintain a healthy weight Body mass index (BMI) is used to identify weight problems. It estimates body fat based on height and weight. Your health care provider can help determine your BMI and help you achieve or maintain a healthy weight. Get regular exercise Get regular exercise. This is one of the most important things you can do for your health. Most adults should: Exercise for at least 150 minutes each week. The exercise should increase your heart rate and make you sweat (moderate-intensity exercise). Do strengthening exercises at least twice a week. This is in addition to the moderate-intensity exercise. Spend less time sitting. Even light physical activity can be beneficial. Watch cholesterol and blood lipids Have your blood tested for lipids and cholesterol at 56 years of age, then have this test every 5 years. Have your cholesterol levels checked more often if: Your lipid or cholesterol levels are high. You are older than 56 years of  age. You are at high risk for heart disease. What should I know about cancer screening? Depending on your health history and family history, you may need to have cancer screening at various ages. This may include screening for: Breast cancer. Cervical cancer. Colorectal cancer. Skin cancer. Lung cancer. What should I know about heart disease, diabetes, and high blood pressure? Blood pressure and heart disease High blood pressure causes heart disease and increases the risk of stroke. This is more likely to develop in people who have high blood pressure readings or are overweight. Have your blood pressure checked: Every 3-5 years if you are 18-39 years of age. Every year if you are 40 years old or older. Diabetes Have regular diabetes screenings. This checks your fasting blood sugar level. Have the screening done: Once every three years after age 40 if you are at a normal weight and have a low risk for diabetes. More often and at a younger age if you are overweight or have a high risk for diabetes. What should I know about preventing infection? Hepatitis B If you have a higher risk for hepatitis B, you should be screened for this virus. Talk with your health care provider to find out if you are at risk for hepatitis B infection. Hepatitis C Testing is recommended for: Everyone born from 1945 through 1965. Anyone with known risk factors for hepatitis C. Sexually transmitted infections (STIs) Get screened for STIs, including gonorrhea and chlamydia, if: You are sexually active and are younger than 56 years of age. You are   older than 56 years of age and your health care provider tells you that you are at risk for this type of infection. Your sexual activity has changed since you were last screened, and you are at increased risk for chlamydia or gonorrhea. Ask your health care provider if you are at risk. Ask your health care provider about whether you are at high risk for HIV. Your health  care provider may recommend a prescription medicine to help prevent HIV infection. If you choose to take medicine to prevent HIV, you should first get tested for HIV. You should then be tested every 3 months for as long as you are taking the medicine. Pregnancy If you are about to stop having your period (premenopausal) and you may become pregnant, seek counseling before you get pregnant. Take 400 to 800 micrograms (mcg) of folic acid every day if you become pregnant. Ask for birth control (contraception) if you want to prevent pregnancy. Osteoporosis and menopause Osteoporosis is a disease in which the bones lose minerals and strength with aging. This can result in bone fractures. If you are 65 years old or older, or if you are at risk for osteoporosis and fractures, ask your health care provider if you should: Be screened for bone loss. Take a calcium or vitamin D supplement to lower your risk of fractures. Be given hormone replacement therapy (HRT) to treat symptoms of menopause. Follow these instructions at home: Alcohol use Do not drink alcohol if: Your health care provider tells you not to drink. You are pregnant, may be pregnant, or are planning to become pregnant. If you drink alcohol: Limit how much you have to: 0-1 drink a day. Know how much alcohol is in your drink. In the [place] one drink equals one 12 oz bottle of beer (355 mL), one 5 oz glass of wine (148 mL), or one 1 oz glass of hard liquor (44 mL). Lifestyle Do not use any products that contain nicotine or tobacco. These products include cigarettes, chewing tobacco, and vaping devices, such as e-cigarettes. If you need help quitting, ask your health care provider. Do not use street drugs. Do not share needles. Ask your health care provider for help if you need support or information about quitting drugs. General instructions Schedule regular health, dental, and eye exams. Stay current with your vaccines. Tell your health  care provider if: You often feel depressed. You have ever been abused or do not feel safe at home. Summary Adopting a healthy lifestyle and getting preventive care are important in promoting health and wellness. Follow your health care provider's instructions about healthy diet, exercising, and getting tested or screened for diseases. Follow your health care provider's instructions on monitoring your cholesterol and blood pressure. This information is not intended to replace advice given to you by your health care provider. Make sure you discuss any questions you have with your health care provider. Document Revised: 07/22/2020 Document Reviewed: 07/22/2020 Elsevier Patient Education  2023 Elsevier Inc.  

## 2022-01-27 LAB — CBC WITH DIFFERENTIAL/PLATELET
Absolute Monocytes: 390 cells/uL (ref 200–950)
Basophils Absolute: 51 cells/uL (ref 0–200)
Basophils Relative: 0.8 %
Eosinophils Absolute: 19 cells/uL (ref 15–500)
Eosinophils Relative: 0.3 %
HCT: 36 % (ref 35.0–45.0)
Hemoglobin: 12.3 g/dL (ref 11.7–15.5)
Lymphs Abs: 1952 cells/uL (ref 850–3900)
MCH: 31.2 pg (ref 27.0–33.0)
MCHC: 34.2 g/dL (ref 32.0–36.0)
MCV: 91.4 fL (ref 80.0–100.0)
MPV: 10.7 fL (ref 7.5–12.5)
Monocytes Relative: 6.1 %
Neutro Abs: 3987 cells/uL (ref 1500–7800)
Neutrophils Relative %: 62.3 %
Platelets: 316 10*3/uL (ref 140–400)
RBC: 3.94 10*6/uL (ref 3.80–5.10)
RDW: 12.2 % (ref 11.0–15.0)
Total Lymphocyte: 30.5 %
WBC: 6.4 10*3/uL (ref 3.8–10.8)

## 2022-01-27 LAB — TSH: TSH: 1.04 mIU/L

## 2022-01-27 LAB — COMPREHENSIVE METABOLIC PANEL
AG Ratio: 2.3 (calc) (ref 1.0–2.5)
ALT: 18 U/L (ref 6–29)
AST: 15 U/L (ref 10–35)
Albumin: 4.4 g/dL (ref 3.6–5.1)
Alkaline phosphatase (APISO): 49 U/L (ref 37–153)
BUN: 16 mg/dL (ref 7–25)
CO2: 28 mmol/L (ref 20–32)
Calcium: 9.8 mg/dL (ref 8.6–10.4)
Chloride: 104 mmol/L (ref 98–110)
Creat: 0.78 mg/dL (ref 0.50–1.03)
Globulin: 1.9 g/dL (calc) (ref 1.9–3.7)
Glucose, Bld: 89 mg/dL (ref 65–99)
Potassium: 4.5 mmol/L (ref 3.5–5.3)
Sodium: 141 mmol/L (ref 135–146)
Total Bilirubin: 0.7 mg/dL (ref 0.2–1.2)
Total Protein: 6.3 g/dL (ref 6.1–8.1)

## 2022-01-27 LAB — HEMOGLOBIN A1C
Hgb A1c MFr Bld: 5.3 % of total Hgb (ref <5.7)
Mean Plasma Glucose: 105 mg/dL
eAG (mmol/L): 5.8 mmol/L

## 2022-01-27 LAB — LIPID PANEL
Cholesterol: 210 mg/dL — ABNORMAL HIGH (ref <200)
HDL: 85 mg/dL (ref >=50)
LDL Cholesterol (Calc): 104 mg/dL (calc) — ABNORMAL HIGH
Non-HDL Cholesterol (Calc): 125 mg/dL (calc) (ref <130)
Total CHOL/HDL Ratio: 2.5 (calc) (ref <5.0)
Triglycerides: 113 mg/dL (ref <150)

## 2022-03-27 ENCOUNTER — Ambulatory Visit (INDEPENDENT_AMBULATORY_CARE_PROVIDER_SITE_OTHER): Payer: 59 | Admitting: Obstetrics & Gynecology

## 2022-03-27 ENCOUNTER — Encounter: Payer: Self-pay | Admitting: Obstetrics & Gynecology

## 2022-03-27 ENCOUNTER — Other Ambulatory Visit (HOSPITAL_COMMUNITY)
Admission: RE | Admit: 2022-03-27 | Discharge: 2022-03-27 | Disposition: A | Discharge status: Home / Self Care | Payer: 59 | Source: Ambulatory Visit | Attending: Obstetrics & Gynecology | Admitting: Obstetrics & Gynecology

## 2022-03-27 VITALS — BP 104/64 | HR 67 | Ht 63.25 in | Wt 119.0 lb

## 2022-03-27 DIAGNOSIS — Z01419 Encounter for gynecological examination (general) (routine) without abnormal findings: Secondary | ICD-10-CM | POA: Insufficient documentation

## 2022-03-27 DIAGNOSIS — Z975 Presence of (intrauterine) contraceptive device: Secondary | ICD-10-CM | POA: Diagnosis not present

## 2022-03-27 DIAGNOSIS — N870 Mild cervical dysplasia: Secondary | ICD-10-CM | POA: Diagnosis not present

## 2022-03-27 NOTE — Progress Notes (Signed)
April Sexton Oct 01, 1965 [id number]   History:    57 y.o. G1P1L1  Originally from [place].  Married.  Moved to [place] in 2019. Daughter is 33+ yo.   RP:  Established patient presenting for annual gyn exam    HPI: Well on Mirena IUD x 11/2016.  No vaginal bleeding.  No Menopausal Sx.  No pelvic pain.  No pain with IC.  Last Pap 12/2021 LGSIL.  Colpo 05/19/2021 Mild dysplasia/CIN 1. HPV HR Neg.  Pap reflex today.  Breasts normal.  Bilateral Breast MRI 4/23, Lt breast suspicious, Lt breast Bx Adenosis/Fibrocystic disease 06/2021.  Colono 04/2020. BMI 20.91. Good fitness. Health labs with Fam MD. Flu vaccine at pharmacy.  Past medical history,surgical history, family history and social history were all reviewed and documented in the EPIC chart.  Gynecologic History No LMP recorded. (Menstrual status: IUD).  Obstetric History OB History  Gravida Para Term Preterm AB Living  '1 1 1     1  '$ SAB IAB Ectopic Multiple Live Births               # Outcome Date GA Lbr Len/2nd Weight Sex Delivery Anes PTL Lv  1 Term              ROS: A ROS was performed and pertinent positives and negatives are included in the history. GENERAL: No fevers or chills. HEENT: No change in vision, no earache, sore throat or sinus congestion. NECK: No pain or stiffness. CARDIOVASCULAR: No chest pain or pressure. No palpitations. PULMONARY: No shortness of breath, cough or wheeze. GASTROINTESTINAL: No abdominal pain, nausea, vomiting or diarrhea, melena or bright red blood per rectum. GENITOURINARY: No urinary frequency, urgency, hesitancy or dysuria. MUSCULOSKELETAL: No joint or muscle pain, no back pain, no recent trauma. DERMATOLOGIC: No rash, no itching, no lesions. ENDOCRINE: No polyuria, polydipsia, no heat or cold intolerance. No recent change in weight. HEMATOLOGICAL: No anemia or easy bruising or bleeding. NEUROLOGIC: No headache, seizures, numbness, tingling or weakness. PSYCHIATRIC: No depression, no  loss of interest in normal activity or change in sleep pattern.     Exam:   BP 104/64   Pulse 67   Ht 5' 3.25" (1.607 m)   Wt 119 lb (54 kg)   SpO2 99%   BMI 20.91 kg/m   Body mass index is 20.91 kg/m.  General appearance : Well developed well nourished female. No acute distress HEENT: Eyes: no retinal hemorrhage or exudates,  Neck supple, trachea midline, no carotid bruits, no thyroidmegaly Lungs: Clear to auscultation, no rhonchi or wheezes, or rib retractions  Heart: Regular rate and rhythm, no murmurs or gallops Breast:Examined in sitting and supine position were symmetrical in appearance, no palpable masses or tenderness,  no skin retraction, no nipple inversion, no nipple discharge, no skin discoloration, no axillary or supraclavicular lymphadenopathy Abdomen: no palpable masses or tenderness, no rebound or guarding Extremities: no edema or skin discoloration or tenderness  Pelvic: Vulva: Normal             Vagina: No gross lesions or discharge  Cervix: No gross lesions or discharge.  [facility] strings [facility] at [facility].  Pap reflex done.  Uterus  AV, normal size, shape and consistency, non-tender and mobile  Adnexa  Without masses or tenderness  Anus: Normal   Assessment/Plan:  57 y.o. female for annual exam   1. Encounter for routine gynecological examination with Papanicolaou smear of cervix Well on Mirena IUD x 11/2016.  No vaginal bleeding.  No Menopausal Sx.  No pelvic pain. No pain with IC.  Last Pap 12/2021 LGSIL.  Colpo 05/19/2021 Mild dysplasia/CIN 1. HPV HR Neg.  Pap reflex today.  Breasts normal.  Bilateral Breast MRI 4/23, Lt breast suspicious, Lt breast Bx Adenosis/Fibrocystic disease 06/2021.  Colono 04/2020. BMI 20.91. Good fitness. Health labs with Fam MD. Flu vaccine at pharmacy. - Cytology - PAP( [place])  2. Mild dysplasia of cervix (CIN I) - Cytology - PAP( [place])  3. IUD (intrauterine device) in place Mirena IUD x 11/2016 well tolerated and in good  position.  Other orders - CALCIUM PO; Take by mouth.   Princess Bruins MD, 1:49 PM

## 2022-04-01 LAB — CYTOLOGY - PAP
Diagnosis: NEGATIVE
Diagnosis: REACTIVE

## 2022-06-24 ENCOUNTER — Other Ambulatory Visit: Payer: Self-pay | Admitting: Family Medicine

## 2022-06-24 DIAGNOSIS — N6099 Unspecified benign mammary dysplasia of unspecified breast: Secondary | ICD-10-CM

## 2022-07-13 ENCOUNTER — Ambulatory Visit
Admission: RE | Admit: 2022-07-13 | Discharge: 2022-07-13 | Disposition: A | Discharge status: Home / Self Care | Payer: 59 | Source: Ambulatory Visit | Attending: Family Medicine | Admitting: Family Medicine

## 2022-07-13 DIAGNOSIS — N6099 Unspecified benign mammary dysplasia of unspecified breast: Secondary | ICD-10-CM

## 2022-07-29 ENCOUNTER — Encounter: Payer: Self-pay | Admitting: Surgery

## 2022-08-28 ENCOUNTER — Other Ambulatory Visit: Payer: 59

## 2022-09-05 ENCOUNTER — Other Ambulatory Visit: Payer: Self-pay | Admitting: Family Medicine

## 2022-09-05 DIAGNOSIS — G43809 Other migraine, not intractable, without status migrainosus: Secondary | ICD-10-CM

## 2023-12-07 ENCOUNTER — Encounter: Payer: Self-pay | Admitting: Internal Medicine
